# Patient Record
Sex: Male | Born: 1935 | Hispanic: No | Marital: Single | Smoking: Never smoker
Health system: Southern US, Community
[De-identification: ages and names within clinical notes are randomized; demographics above are authoritative.]

## PROBLEM LIST (undated history)

## (undated) DIAGNOSIS — F1010 Alcohol abuse, uncomplicated: Secondary | ICD-10-CM

---

## 2015-06-21 ENCOUNTER — Inpatient Hospital Stay (HOSPITAL_COMMUNITY)
Admission: EM | Admit: 2015-06-21 | Discharge: 2015-06-27 | DRG: 064 | Disposition: A | Discharge status: Rehabilitation Facility | Payer: Medicare Other | Attending: Internal Medicine | Admitting: Internal Medicine

## 2015-06-21 ENCOUNTER — Encounter (HOSPITAL_COMMUNITY): Payer: Self-pay | Admitting: Emergency Medicine

## 2015-06-21 ENCOUNTER — Emergency Department (HOSPITAL_COMMUNITY): Payer: Medicare Other

## 2015-06-21 DIAGNOSIS — J9600 Acute respiratory failure, unspecified whether with hypoxia or hypercapnia: Secondary | ICD-10-CM | POA: Diagnosis present

## 2015-06-21 DIAGNOSIS — I161 Hypertensive emergency: Secondary | ICD-10-CM | POA: Diagnosis present

## 2015-06-21 DIAGNOSIS — I611 Nontraumatic intracerebral hemorrhage in hemisphere, cortical: Secondary | ICD-10-CM | POA: Diagnosis not present

## 2015-06-21 DIAGNOSIS — G9340 Encephalopathy, unspecified: Secondary | ICD-10-CM | POA: Diagnosis present

## 2015-06-21 DIAGNOSIS — R739 Hyperglycemia, unspecified: Secondary | ICD-10-CM | POA: Diagnosis present

## 2015-06-21 DIAGNOSIS — G936 Cerebral edema: Secondary | ICD-10-CM | POA: Diagnosis present

## 2015-06-21 DIAGNOSIS — I10 Essential (primary) hypertension: Secondary | ICD-10-CM

## 2015-06-21 DIAGNOSIS — R402433 Glasgow coma scale score 3-8, at hospital admission: Secondary | ICD-10-CM | POA: Diagnosis present

## 2015-06-21 DIAGNOSIS — Z4659 Encounter for fitting and adjustment of other gastrointestinal appliance and device: Secondary | ICD-10-CM

## 2015-06-21 DIAGNOSIS — R4701 Aphasia: Secondary | ICD-10-CM | POA: Diagnosis present

## 2015-06-21 DIAGNOSIS — I619 Nontraumatic intracerebral hemorrhage, unspecified: Secondary | ICD-10-CM

## 2015-06-21 DIAGNOSIS — R451 Restlessness and agitation: Secondary | ICD-10-CM | POA: Diagnosis present

## 2015-06-21 DIAGNOSIS — S0633AA Contusion and laceration of cerebrum, unspecified, with loss of consciousness status unknown, initial encounter: Secondary | ICD-10-CM

## 2015-06-21 DIAGNOSIS — R0902 Hypoxemia: Secondary | ICD-10-CM

## 2015-06-21 DIAGNOSIS — S06360A Traumatic hemorrhage of cerebrum, unspecified, without loss of consciousness, initial encounter: Secondary | ICD-10-CM

## 2015-06-21 DIAGNOSIS — J9690 Respiratory failure, unspecified, unspecified whether with hypoxia or hypercapnia: Secondary | ICD-10-CM

## 2015-06-21 DIAGNOSIS — E876 Hypokalemia: Secondary | ICD-10-CM | POA: Diagnosis present

## 2015-06-21 DIAGNOSIS — F10239 Alcohol dependence with withdrawal, unspecified: Secondary | ICD-10-CM | POA: Diagnosis present

## 2015-06-21 DIAGNOSIS — R569 Unspecified convulsions: Secondary | ICD-10-CM

## 2015-06-21 HISTORY — DX: Alcohol abuse, uncomplicated: F10.10

## 2015-06-21 LAB — CBC
HCT: 46.7 % (ref 39.0–52.0)
Hemoglobin: 15.4 g/dL (ref 13.0–17.0)
MCH: 29.8 pg (ref 26.0–34.0)
MCHC: 33 g/dL (ref 30.0–36.0)
MCV: 90.3 fL (ref 78.0–100.0)
Platelets: 254 10*3/uL (ref 150–400)
RBC: 5.17 MIL/uL (ref 4.22–5.81)
RDW: 13.2 % (ref 11.5–15.5)
WBC: 16.1 10*3/uL — AB (ref 4.0–10.5)

## 2015-06-21 LAB — MRSA PCR SCREENING: MRSA by PCR: NEGATIVE

## 2015-06-21 LAB — COMPREHENSIVE METABOLIC PANEL
ALBUMIN: 3.7 g/dL (ref 3.5–5.0)
ALK PHOS: 78 U/L (ref 38–126)
ALT: 24 U/L (ref 17–63)
AST: 39 U/L (ref 15–41)
Anion gap: 7 (ref 5–15)
BILIRUBIN TOTAL: 1 mg/dL (ref 0.3–1.2)
BUN: 11 mg/dL (ref 6–20)
CO2: 28 mmol/L (ref 22–32)
CREATININE: 0.68 mg/dL (ref 0.61–1.24)
Calcium: 9 mg/dL (ref 8.9–10.3)
Chloride: 101 mmol/L (ref 101–111)
GFR calc Af Amer: >60 mL/min (ref >60)
GLUCOSE: 141 mg/dL — AB (ref 65–99)
Potassium: 4 mmol/L (ref 3.5–5.1)
Sodium: 136 mmol/L (ref 135–145)
TOTAL PROTEIN: 7.6 g/dL (ref 6.5–8.1)

## 2015-06-21 LAB — I-STAT TROPONIN, ED: TROPONIN I, POC: 0 ng/mL (ref 0.00–0.08)

## 2015-06-21 LAB — I-STAT CHEM 8, ED
BUN: 14 mg/dL (ref 6–20)
CHLORIDE: 98 mmol/L — AB (ref 101–111)
CREATININE: 0.7 mg/dL (ref 0.61–1.24)
Calcium, Ion: 1.21 mmol/L (ref 1.13–1.30)
GLUCOSE: 141 mg/dL — AB (ref 65–99)
HEMATOCRIT: 51 % (ref 39.0–52.0)
Hemoglobin: 17.3 g/dL — ABNORMAL HIGH (ref 13.0–17.0)
POTASSIUM: 4 mmol/L (ref 3.5–5.1)
Sodium: 138 mmol/L (ref 135–145)
TCO2: 30 mmol/L (ref 0–100)

## 2015-06-21 LAB — DIFFERENTIAL
BASOS ABS: 0 10*3/uL (ref 0.0–0.1)
Basophils Relative: 0 %
Eosinophils Absolute: 0 10*3/uL (ref 0.0–0.7)
Eosinophils Relative: 0 %
LYMPHS ABS: 1.7 10*3/uL (ref 0.7–4.0)
LYMPHS PCT: 11 %
MONOS PCT: 4 %
Monocytes Absolute: 0.7 10*3/uL (ref 0.1–1.0)
NEUTROS ABS: 13.6 10*3/uL — AB (ref 1.7–7.7)
NEUTROS PCT: 85 %

## 2015-06-21 LAB — PROTIME-INR
INR: 1.11 (ref 0.00–1.49)
Prothrombin Time: 14.5 seconds (ref 11.6–15.2)

## 2015-06-21 LAB — APTT: APTT: 30 s (ref 24–37)

## 2015-06-21 LAB — CBG MONITORING, ED: Glucose-Capillary: 154 mg/dL — ABNORMAL HIGH (ref 65–99)

## 2015-06-21 MED ORDER — PROPOFOL 1000 MG/100ML IV EMUL
5.0000 ug/kg/min | INTRAVENOUS | Status: DC
  Administered 2015-06-21 – 2015-06-22 (×3): 70 ug/kg/min via INTRAVENOUS
  Administered 2015-06-22: 50 ug/kg/min via INTRAVENOUS
  Administered 2015-06-22 – 2015-06-23 (×3): 70 ug/kg/min via INTRAVENOUS
  Filled 2015-06-21 (×10): qty 100

## 2015-06-21 MED ORDER — PANTOPRAZOLE SODIUM 40 MG IV SOLR
40.0000 mg | Freq: Every day | INTRAVENOUS | Status: DC
  Administered 2015-06-21 – 2015-06-26 (×6): 40 mg via INTRAVENOUS
  Filled 2015-06-21 (×6): qty 40

## 2015-06-21 MED ORDER — MIDAZOLAM HCL 2 MG/2ML IJ SOLN
INTRAMUSCULAR | Status: AC
  Administered 2015-06-21: 5 mg via INTRAVENOUS
  Filled 2015-06-21: qty 6

## 2015-06-21 MED ORDER — LIDOCAINE HCL (CARDIAC) 20 MG/ML IV SOLN
100.0000 mg | Freq: Once | INTRAVENOUS | Status: AC
  Administered 2015-06-21: 100 mg via INTRAVENOUS

## 2015-06-21 MED ORDER — CHLORHEXIDINE GLUCONATE 0.12% ORAL RINSE (MEDLINE KIT)
15.0000 mL | Freq: Two times a day (BID) | OROMUCOSAL | Status: DC
Start: 2015-06-21 — End: 2015-06-24
  Administered 2015-06-21 – 2015-06-24 (×7): 15 mL via OROMUCOSAL

## 2015-06-21 MED ORDER — ACETAMINOPHEN 650 MG RE SUPP: 650.0000 mg | RECTAL | Status: DC | PRN

## 2015-06-21 MED ORDER — SENNOSIDES-DOCUSATE SODIUM 8.6-50 MG PO TABS
1.0000 | ORAL_TABLET | Freq: Two times a day (BID) | ORAL | Status: DC
  Administered 2015-06-22 – 2015-06-27 (×8): 1 via ORAL
  Filled 2015-06-21 (×9): qty 1

## 2015-06-21 MED ORDER — LEVETIRACETAM 500 MG/5ML IV SOLN
1000.0000 mg | Freq: Once | INTRAVENOUS | Status: AC
  Administered 2015-06-21: 1000 mg via INTRAVENOUS
  Filled 2015-06-21: qty 10

## 2015-06-21 MED ORDER — PROPOFOL 1000 MG/100ML IV EMUL
5.0000 ug/kg/min | Freq: Once | INTRAVENOUS | Status: AC
  Administered 2015-06-21: 5 ug/kg/min via INTRAVENOUS
  Filled 2015-06-21: qty 100

## 2015-06-21 MED ORDER — SODIUM CHLORIDE 0.9 % IV SOLN
500.0000 mg | Freq: Two times a day (BID) | INTRAVENOUS | Status: DC
  Administered 2015-06-21 – 2015-06-26 (×11): 500 mg via INTRAVENOUS
  Filled 2015-06-21 (×12): qty 5

## 2015-06-21 MED ORDER — STROKE: EARLY STAGES OF RECOVERY BOOK
Freq: Once | Status: AC
  Administered 2015-06-21: 1
  Filled 2015-06-21: qty 1

## 2015-06-21 MED ORDER — ANTISEPTIC ORAL RINSE SOLUTION (CORINZ)
7.0000 mL | Freq: Four times a day (QID) | OROMUCOSAL | Status: DC
  Administered 2015-06-21 – 2015-06-24 (×11): 7 mL via OROMUCOSAL

## 2015-06-21 MED ORDER — ETOMIDATE 2 MG/ML IV SOLN
20.0000 mg | Freq: Once | INTRAVENOUS | Status: AC
  Administered 2015-06-21: 20 mg via INTRAVENOUS

## 2015-06-21 MED ORDER — LABETALOL HCL 5 MG/ML IV SOLN
10.0000 mg | INTRAVENOUS | Status: DC | PRN
  Administered 2015-06-24: 40 mg via INTRAVENOUS
  Administered 2015-06-24: 20 mg via INTRAVENOUS
  Filled 2015-06-21: qty 4
  Filled 2015-06-21: qty 8

## 2015-06-21 MED ORDER — SUCCINYLCHOLINE CHLORIDE 20 MG/ML IJ SOLN
100.0000 mg | Freq: Once | INTRAMUSCULAR | Status: DC
  Filled 2015-06-21: qty 5

## 2015-06-21 MED ORDER — SODIUM CHLORIDE 0.9 % IV SOLN
INTRAVENOUS | Status: DC
  Administered 2015-06-21 – 2015-06-22 (×2): via INTRAVENOUS

## 2015-06-21 MED ORDER — SUCCINYLCHOLINE CHLORIDE 20 MG/ML IJ SOLN
120.0000 mg | Freq: Once | INTRAMUSCULAR | Status: DC
  Administered 2015-06-21: 100 mg via INTRAVENOUS
  Filled 2015-06-21: qty 6

## 2015-06-21 MED ORDER — ACETAMINOPHEN 325 MG PO TABS: 650.0000 mg | ORAL_TABLET | ORAL | Status: DC | PRN

## 2015-06-21 MED ORDER — NICARDIPINE HCL IN NACL 20-0.86 MG/200ML-% IV SOLN
3.0000 mg/h | INTRAVENOUS | Status: DC
  Administered 2015-06-24: 5 mg/h via INTRAVENOUS
  Filled 2015-06-21: qty 200

## 2015-06-21 MED ORDER — DEXAMETHASONE SODIUM PHOSPHATE 10 MG/ML IJ SOLN
10.0000 mg | Freq: Once | INTRAMUSCULAR | Status: AC
  Administered 2015-06-21: 10 mg via INTRAVENOUS
  Filled 2015-06-21: qty 1

## 2015-06-21 MED ORDER — LORAZEPAM 2 MG/ML IJ SOLN
1.0000 mg | Freq: Once | INTRAMUSCULAR | Status: AC
  Administered 2015-06-21: 1 mg via INTRAVENOUS
  Filled 2015-06-21: qty 1

## 2015-06-21 MED ORDER — NICARDIPINE HCL IN NACL 20-0.86 MG/200ML-% IV SOLN
3.0000 mg/h | Freq: Once | INTRAVENOUS | Status: AC
  Administered 2015-06-21: 3 mg/h via INTRAVENOUS
  Filled 2015-06-21: qty 200

## 2015-06-21 MED ORDER — MIDAZOLAM HCL 5 MG/5ML IJ SOLN
5.0000 mg | Freq: Once | INTRAMUSCULAR | Status: AC
  Administered 2015-06-21: 5 mg via INTRAVENOUS

## 2015-06-21 NOTE — Progress Notes (Signed)
eLink Physician-Brief Progress Note Patient Name: Jillyn HiddenGualberto Ponve DOB: March 09, 1927 MRN: [id number]   Date of Service  06/21/2015  HPI/Events of Note  Patient arrives in ICU. Nurse needs several orders.  eICU Interventions  Will order: 1. Propofol IV infusion. Titrate to RASS = -1. 2. Nicardipine IV infusion. Titrate to keep SBP < 160. 3. OGT to LIS.     Intervention Category Intermediate Interventions: Hypertension - evaluation and management Minor Interventions: Agitation / anxiety - evaluation and management;Routine modifications to care plan (e.g. PRN medications for pain, fever)  Sommer,Steven Eugene 06/21/2015, 6:28 PM

## 2015-06-21 NOTE — Code Documentation (Signed)
Pt. Transferred from CT 3 , due to decreased lOC  And to protect airway

## 2015-06-21 NOTE — Consult Note (Addendum)
Referring Physician: Dr Fayrene FearingJames, ED    Chief Complaint: AMS, new onset seizure, ICH on CT  HPI:                                                                                                                                         Charles Bullock is an 81 y.o. male with a past medical history significant for alcohol abuse and otherwise unknown PMH, brought in for evaluation of mental state changes. Patient is currently intubated and thus all clinical information was obtained from the medical record. He was taken to the ED " after his family noticed behavior changes. Last seen normal last night, then this morning was noted to be confused, putting shoes on the wrong feet, garbled speech. Had rapidly declining mental status in ER, requiring intubation, CT head revealed large L ICH. Had one witnessed seizure". Patient was intubated for airway protection. Loaded with 1 gram IV keppra. No particularly hypertensive in the ED, current SBP 122. CT brain was personally reviewed and showed a lobar left posterior parietal parenchymal hematoma without significant edema, midline shift, or IV extension. Serologies reviewed:INR 1.11, PTT 30, platelet count 244, wbc 16.1  Date last known well: 06/21/15  Time last known well: uncertain tPA Given: no, ICH ICH volume < 30 cc ICH score: 2   Past Medical History  Diagnosis Date  . ETOH abuse     History reviewed. No pertinent past surgical history.  History reviewed. No pertinent family history. Social History:  reports that he has never smoked. He does not have any smokeless tobacco history on file. He reports that he does not drink alcohol or use illicit drugs.  Allergies: No Known Allergies  Medications:                                                                                                                           I have reviewed the patient's current medications.  ROS: unable to obtain due to mental status, intubation  History obtained from chart review   Physical exam:  Constitutional: critically ill, intubated on the vent. Blood pressure 149/93, pulse 110, resp. rate 18, height  (1.651 m), weight 58.968 kg (130 lb), SpO2 97 %. Eyes: no jaundice or exophthalmos.  Head: normocephalic. Neck: supple, no bruits, no JVD. Cardiac: no murmurs. Lungs: clear. Abdomen: soft, no tender, no mass. Extremities: no edema, clubbing, or cyanosis.  Skin: no rash  Neurologic Examination:                                                                                                      General: NAD Mental Status: Paralyzed, intubated on the vent. Cranial Nerves: Pupils 2 mm, reactive. No gaze preference. Face symmetric. Tongue: intubated Motor: Paralyzed. Tone decreased. Sensory: Pinprick and light touch intact throughout, bilaterally Deep Tendon Reflexes:  Unable to elicit but paralyzed Plantars: Mute  Cerebellar: Unable to test due to mental status Gait:  Unable to test due to mental status.   Results for orders placed or performed during the hospital encounter of 06/21/15 (from the past 48 hour(s))  CBG monitoring, ED     Status: Abnormal   Collection Time: 06/21/15  1:31 PM  Result Value Ref Range   Glucose-Capillary 154 (H) 65 - 99 mg/dL  Protime-INR     Status: None   Collection Time: 06/21/15  2:00 PM  Result Value Ref Range   Prothrombin Time 14.5 11.6 - 15.2 seconds   INR 1.11 0.00 - 1.49  APTT     Status: None   Collection Time: 06/21/15  2:00 PM  Result Value Ref Range   aPTT 30 24 - 37 seconds  CBC     Status: Abnormal   Collection Time: 06/21/15  2:00 PM  Result Value Ref Range   WBC 16.1 (H) 4.0 - 10.5 K/uL   RBC 5.17 4.22 - 5.81 MIL/uL   Hemoglobin 15.4 13.0 - 17.0 g/dL   HCT 57.8 46.9 - 62.9 %   MCV 90.3 78.0 - 100.0 fL   MCH 29.8 26.0 - 34.0 pg   MCHC 33.0  30.0 - 36.0 g/dL   RDW 52.8 41.3 - 24.4 %   Platelets 254 150 - 400 K/uL  Differential     Status: Abnormal   Collection Time: 06/21/15  2:00 PM  Result Value Ref Range   Neutrophils Relative % 85 %   Neutro Abs 13.6 (H) 1.7 - 7.7 K/uL   Lymphocytes Relative 11 %   Lymphs Abs 1.7 0.7 - 4.0 K/uL   Monocytes Relative 4 %   Monocytes Absolute 0.7 0.1 - 1.0 K/uL   Eosinophils Relative 0 %   Eosinophils Absolute 0.0 0.0 - 0.7 K/uL   Basophils Relative 0 %   Basophils Absolute 0.0 0.0 - 0.1 K/uL  I-stat troponin, ED (not at [facility])     Status: None   Collection Time: 06/21/15  2:39 PM  Result Value Ref Range   Troponin i, poc 0.00 0.00 - 0.08 ng/mL   Comment 3  Comment: Due to the release kinetics of cTnI, a negative result within the first hours of the onset of symptoms does not rule out myocardial infarction with certainty. If myocardial infarction is still suspected, repeat the test at appropriate intervals.   I-Stat Chem 8, ED  (not at [facility], [facility])     Status: Abnormal   Collection Time: 06/21/15  2:40 PM  Result Value Ref Range   Sodium 138 135 - 145 mmol/L   Potassium 4.0 3.5 - 5.1 mmol/L   Chloride 98 (L) 101 - 111 mmol/L   BUN 14 6 - 20 mg/dL   Creatinine, Ser 1.61 0.61 - 1.24 mg/dL   Glucose, Bld 096 (H) 65 - 99 mg/dL   Calcium, Ion 0.45 4.09 - 1.30 mmol/L   TCO2 30 0 - 100 mmol/L   Hemoglobin 17.3 (H) 13.0 - 17.0 g/dL   HCT 81.1 91.4 - 78.2 %   Ct Head Wo Contrast  06/21/2015  CLINICAL DATA:  Seizure. EXAM: CT HEAD WITHOUT CONTRAST TECHNIQUE: Contiguous axial images were obtained from the base of the skull through the vertex without intravenous contrast. COMPARISON:  None. FINDINGS: Within the left posterior parietal lobe there is a hyperdense mass with surrounding low attenuation edema. The mass measures 5 x 3.3 cm. This is compatible with an area of hemorrhagic infarct. There is prominence of the sulci and ventricles consistent with brain atrophy. Diffuse  low attenuation within the subcortical and periventricular white matter is identified consistent with chronic microvascular disease. Chronic lacunar infarct is identified within the right basal ganglia. There is no midline shift. No intraventricular hemorrhage. But the paranasal sinuses and mastoid air cells are clear. The calvarium is intact. IMPRESSION: 1. Left posterior parietal parenchymal hematoma is identified. Findings compatible with hemorrhagic infarct. Underlying lesion not excluded. Electronically Signed   By: Signa Kell M.D.   On: 06/21/2015 14:34   Dg Chest Port 1 View  06/21/2015  CLINICAL DATA:  Status post intubation EXAM: PORTABLE CHEST 1 VIEW COMPARISON:  None. FINDINGS: The ET tube tip is at the level of the carina and is directed towards the right mainstem bronchus. Consider retracting tube by 1-2 cm. Heart size is normal. There is no pleural fluid. Asymmetric opacification of the right apex and right upper lobe scar or atelectasis is noted. IMPRESSION: 1. ET tube tip is at the carina and directed towards the right mainstem bronchus. Consider retracting by 1-2 cm. 2. Asymmetric opacification of the right apex, etiology uncertain. Suggest further evaluation with CT chest as patient's clinical condition tolerates. These results will be called to the ordering clinician or representative by the Radiologist Assistant, and communication documented in the PACS or zVision Dashboard. Electronically Signed   By: Signa Kell M.D.   On: 06/21/2015 15:18     Assessment: 81 y.o. male with new onset mental state changes, isolated seizure, and CT brain revealing a lobar left posterior parietal parenchymal hematoma without significant edema, midline shift, or IV extension. Isolated seizure is symptomatic of ICH. The lobar location and the fact that he was not particularly hypertensive makes me think this could be ICH secondary to CAA. Unusual location for aneurysmal hemorrhage. He is not  thrombocytopenic and no reported head trauma. Ordered: MRI/MRA brain to further investigate ICH etiology. Follow up CT head in 24 hours or before if clinically warranted. Continue keppra 500 mg iv BID. Target SBP<160. Stroke team will follow up tomorrow.   Stroke Risk Factors -age, ETOH abuse    [place],  MD [facility] [phone]  06/21/2015, 3:21 PM

## 2015-06-21 NOTE — ED Notes (Signed)
Son  Reports Pt drinks 8-cans of beer daily . Son reports last drank on Tuesday .

## 2015-06-21 NOTE — ED Notes (Signed)
Son reports the PT was able to shower with out help last night and Pt dressed unassisted this AM. Son reports Pt woke up and was purple at 9030 this AM. Pt unable to follow directions . Pt was able to stand with assistance  To transfer  to bed.

## 2015-06-21 NOTE — ED Notes (Signed)
EDP James at bed side Pt having seizure. Ativan given.

## 2015-06-21 NOTE — Progress Notes (Signed)
eLink Physician-Brief Progress Note Patient Name: Charles Bullock DOB: 08/15/1926 MRN: [id number]   Date of Service  06/21/2015  HPI/Events of Note  Request for bilateral wrist restraints.  eICU Interventions  Will order bilateral wrist restraints.      Intervention Category Minor Interventions: Agitation / anxiety - evaluation and management  Sommer,Steven Eugene 06/21/2015, 9:55 PM

## 2015-06-21 NOTE — ED Provider Notes (Signed)
CSN: [id number]     Arrival date & time 06/21/15  1216 History   First MD Initiated Contact with Patient 06/21/15 1245     Chief Complaint  Patient presents with  . Stroke Symptoms  . Seizures      HPI  Patient presents for evaluation of  "changes in his behavior".  He is Spanish-speaking only. He arrives with his family. His last time no normal was last night (06/20/2015)  at 10 PM going to bed.  Family states that he seen normal last night was speaking walking interacting with his family normally. His morning upon getting up he had difficult to dressing himself. He put his shoes on the wrong feet. He was not speaking words". Family brought him in for evaluation. No known recent fall or traumas.  No history of hypertension diabetes no history of stroke, heart disease, peripheral vascular disease.  She drinks 8-10 beers daily. No known history of withdrawal or seizures. No describes seizures this morning.  Family states that he cannot walk without difficulty this morning. They do not understand any appreciable words.  Past Medical History  Diagnosis Date  . ETOH abuse    History reviewed. No pertinent past surgical history. History reviewed. No pertinent family history. Social History  Substance Use Topics  . Smoking status: Never Smoker   . Smokeless tobacco: None  . Alcohol Use: No    Review of Systems  Unable to perform ROS: Mental status change      Allergies  Review of patient's allergies indicates no known allergies.  Home Medications   Prior to Admission medications   Not on File   BP 149/93 mmHg  Pulse 110  Resp 18  Ht 5\' 5"  (1.651 m)  Wt 130 lb (58.968 kg)  BMI 21.63 kg/m2  SpO2 97% Physical Exam  Constitutional: He is oriented to person, place, and time. He appears well-developed and well-nourished. No distress.  HENT:  Head: Normocephalic.  Normal tongue protrusion. Normal posterior pharynx. Normal sensation V1 through V3 in the face.  Eyes:  Conjunctivae are normal. Pupils are equal, round, and reactive to light. No scleral icterus.  Right hemianopsia. No extra ocular movement palsy's. No upper or   lower facial droop.  Neck: Normal range of motion. Neck supple. No thyromegaly present.  No carotid bruits.  Cardiovascular: Normal rate and regular rhythm.  Exam reveals no gallop and no friction rub.   No murmur heard. Sinus rhythm on the monitor.  Pulmonary/Chest: Effort normal and breath sounds normal. No respiratory distress. He has no wheezes. He has no rales.  Clear bilateral breath sounds. 87% saturation. Placed on 2 L nasal cannula. Clear lungs. No increased worker breathing.  Abdominal: Soft. Bowel sounds are normal. He exhibits no distension. There is no tenderness. There is no rebound.  Musculoskeletal: Normal range of motion.  Neurological: He is alert and oriented to person, place, and time.  Clinically has a right-sided neglect. Right hemianopsia. Right arm dysmetria and weakness. Right leg weakness.  Skin: Skin is warm and dry. No rash noted.  Psychiatric: He has a normal mood and affect. His behavior is normal.   no tremor or sign of significant withdrawal at this time.  ED Course  Procedures (including critical care time) Labs Review Labs Reviewed  CBC - Abnormal; Notable for the following:    WBC 16.1 (*)    All other components within normal limits  DIFFERENTIAL - Abnormal; Notable for the following:    Neutro Abs 13.6 (*)  All other components within normal limits  CBG MONITORING, ED - Abnormal; Notable for the following:    Glucose-Capillary 154 (*)    All other components within normal limits  I-STAT CHEM 8, ED - Abnormal; Notable for the following:    Chloride 98 (*)    Glucose, Bld 141 (*)    Hemoglobin 17.3 (*)    All other components within normal limits  PROTIME-INR  APTT  COMPREHENSIVE METABOLIC PANEL  I-STAT TROPOININ, ED    Imaging Review Ct Head Wo Contrast  06/21/2015  CLINICAL  DATA:  Seizure. EXAM: CT HEAD WITHOUT CONTRAST TECHNIQUE: Contiguous axial images were obtained from the base of the skull through the vertex without intravenous contrast. COMPARISON:  None. FINDINGS: Within the left posterior parietal lobe there is a hyperdense mass with surrounding low attenuation edema. The mass measures 5 x 3.3 cm. This is compatible with an area of hemorrhagic infarct. There is prominence of the sulci and ventricles consistent with brain atrophy. Diffuse low attenuation within the subcortical and periventricular white matter is identified consistent with chronic microvascular disease. Chronic lacunar infarct is identified within the right basal ganglia. There is no midline shift. No intraventricular hemorrhage. But the paranasal sinuses and mastoid air cells are clear. The calvarium is intact. IMPRESSION: 1. Left posterior parietal parenchymal hematoma is identified. Findings compatible with hemorrhagic infarct. Underlying lesion not excluded. Electronically Signed   By: Signa Kell M.D.   On: 06/21/2015 14:34   I have personally reviewed and evaluated these images and lab results as part of my medical decision-making.   EKG Interpretation None      MDM   Final diagnoses:  Intraparenchymal hemorrhage of brain (HCC)  Seizure (HCC)  Malignant hypertension    Patient presents with over 15 hours of symptoms. Well out of the window for acute lytic therapy. Symptoms suggestive of left MCA stroke.  Has not had alcohol in 5 days. However, is not tremulous, or tachycardic. No described seizure this morning.  Differential diagnoses would include left MCA stroke, hemorrhage. Also Todd's paresis after seizure  (unwitnessed).  CT to rule out hemorrhage. Further diagnostics ongoing.  14:00:  Called to room by RN for generalized seizure. Arrived to find patient with eyes deviated right. Generalized tonic-clonic activity. No incontinence or tongue biting. Slowly improving over 1-2  minutes. Given IV Ativan 1 mg. Will load with Keppra. Differential diagnosis remains the same as above pending CT.  15:14:  Patient had additional seizure in CT. CT scan shows left parietal occipital intraparenchymal hemorrhage with surrounding vasogenic edema. No midline shift. Patient's middle status deteriorated. No protecting his airway. Decreasing respiratory rate. Not verbally responsive. I discussed this at length with family. He does not have any established DO NOT RESUSCITATE, for plan for end-of-life care. Family feels he would want full resuscitative measures. They're agreeable with intubation.  INTUBATION Performed by: Claudean Kinds  Required items: required blood products, implants, devices, and special equipment available Patient identity confirmed: provided demographic data and hospital-assigned identification number Time out: Immediately prior to procedure a "time out" was called to verify the correct patient, procedure, equipment, support staff and site/side marked as required.  Indications: Decreased level of consciousness, airway protection.   Intubation method: +Glidescope Laryngoscopy   Preoxygenation: +BVM Pretreatment lidocaine 100 mg prior to sedation and paralytic as CNS prophylaxis. Sedatives: +Etomidate Paralytic: +Succinylcholine  Tube Size: 7.5 cuffed  Post-procedure assessment: chest rise and ETCO2 monitor Breath sounds: equal and absent over the epigastrium Tube secured  with: ETT holder Chest x-ray interpreted by radiologist and me.  Chest x-ray findings: +endotracheal tube just proximal to the carina. Withdrawal 27 m and resecured.   Patient tolerated the procedure well with no immediate complications.  Family taken back to the room. Critical care consultation. Dr. Craige Cotta present in the room.   CRITICAL CARE Performed by: Rolland Porter JOSEPH   Total critical care time: 60 minutes  Critical care time was exclusive of separately billable  procedures and treating other patients.  Critical care was necessary to treat or prevent imminent or life-threatening deterioration.  Critical care was time spent personally by me on the following activities: development of treatment plan with patient and/or surrogate as well as nursing, discussions with consultants, evaluation of patient's response to treatment, examination of patient, obtaining history from patient or surrogate, ordering and performing treatments and interventions, ordering and review of laboratory studies, ordering and review of radiographic studies, pulse oximetry and re-evaluation of patient's condition. Care      Rolland Porter, MD 06/21/15 [phone]

## 2015-06-21 NOTE — ED Notes (Signed)
Pt here with family c/o trouble understanding pt speech this am upon waking and pt put his shoes on backwards; per family is aphasia; pt stumbling when walking; pt unable to follow commands

## 2015-06-21 NOTE — H&P (Signed)
PULMONARY / CRITICAL CARE MEDICINE   Name: Charles Bullock MRN: [id number] DOB: 1927-04-30    ADMISSION DATE:  06/21/2015  REFERRING MD:  EDP   CHIEF COMPLAINT:  Respiratory failure, AMS   HISTORY OF PRESENT ILLNESS:   81yo spanish speaking male with hx heavy ETOH, otherwise unknown PMH presented 12/17 after his family noticed behavior changes.  Last seen normal last night, then this morning was noted to be confused, putting shoes on the wrong feet, garbled speech.  Had rapidly declining mental status in ER, requiring intubation, CT head revealed large L ICH.  Had one witness seizure, none further noted. PCCM called for ICU admission.   Per family no known hx THN, DM, previous stroke.    PAST MEDICAL HISTORY :  He  has a past medical history of ETOH abuse.  PAST SURGICAL HISTORY: He  has no past surgical history on file.  No Known Allergies  No current facility-administered medications on file prior to encounter.   No current outpatient prescriptions on file prior to encounter.    FAMILY HISTORY:  No known family hx bleeding disorders   SOCIAL HISTORY: He  reports that he has never smoked. He does not have any smokeless tobacco history on file. He reports that he does not drink alcohol or use illicit drugs.  REVIEW OF SYSTEMS:   As per HPI obtained from chart and family - All other systems reviewed and were neg.    SUBJECTIVE:    VITAL SIGNS: BP 149/93 mmHg  Pulse 110  Resp 18  Ht  (1.651 m)  Wt 130 lb (58.968 kg)  BMI 21.63 kg/m2  SpO2 97%  HEMODYNAMICS:    VENTILATOR SETTINGS: Vent Mode:  [-] PRVC FiO2 (%):  [40 %] 40 % Set Rate:  [18 bmp] 18 bmp Vt Set:  [500 mL] 500 mL PEEP:  [5 cmH20] 5 cmH20 Plateau Pressure:  [14 cmH20] 14 cmH20  INTAKE / OUTPUT:    PHYSICAL EXAMINATION: General:  Thin, elderly male, NAD  Neuro:  Unresponsive, pupils reactive HEENT:  Mm moist, ETT Cardiovascular:  s1s2 rrr Lungs:  resps even non labored on  vent Abdomen:  Soft, hypoactive bs  Musculoskeletal:  Warm and dry, no edema   LABS:  BMET  Recent Labs Lab 06/21/15 1400 06/21/15 1440  NA 136 138  K 4.0 4.0  CL 101 98*  CO2 28  --   BUN 11 14  CREATININE 0.68 0.70  GLUCOSE 141* 141*    Electrolytes  Recent Labs Lab 06/21/15 1400  CALCIUM 9.0    CBC  Recent Labs Lab 06/21/15 1400 06/21/15 1440  WBC 16.1*  --   HGB 15.4 17.3*  HCT 46.7 51.0  PLT 254  --     Coag's  Recent Labs Lab 06/21/15 1400  APTT 30  INR 1.11    Sepsis Markers No results for input(s): LATICACIDVEN, PROCALCITON, O2SATVEN in the last 168 hours.  ABG No results for input(s): PHART, PCO2ART, PO2ART in the last 168 hours.  Liver Enzymes  Recent Labs Lab 06/21/15 1400  AST 39  ALT 24  ALKPHOS 78  BILITOT 1.0  ALBUMIN 3.7    Cardiac Enzymes No results for input(s): TROPONINI, PROBNP in the last 168 hours.  Glucose  Recent Labs Lab 06/21/15 1331  GLUCAP 154*    Imaging Ct Head Wo Contrast  06/21/2015  CLINICAL DATA:  Seizure. EXAM: CT HEAD WITHOUT CONTRAST TECHNIQUE: Contiguous axial images were obtained from the base of the skull  through the vertex without intravenous contrast. COMPARISON:  None. FINDINGS: Within the left posterior parietal lobe there is a hyperdense mass with surrounding low attenuation edema. The mass measures 5 x 3.3 cm. This is compatible with an area of hemorrhagic infarct. There is prominence of the sulci and ventricles consistent with brain atrophy. Diffuse low attenuation within the subcortical and periventricular white matter is identified consistent with chronic microvascular disease. Chronic lacunar infarct is identified within the right basal ganglia. There is no midline shift. No intraventricular hemorrhage. But the paranasal sinuses and mastoid air cells are clear. The calvarium is intact. IMPRESSION: 1. Left posterior parietal parenchymal hematoma is identified. Findings compatible with  hemorrhagic infarct. Underlying lesion not excluded. Electronically Signed   By: Signa Kellaylor  Stroud M.D.   On: 06/21/2015 14:34   Dg Chest Port 1 View  06/21/2015  CLINICAL DATA:  Status post intubation EXAM: PORTABLE CHEST 1 VIEW COMPARISON:  None. FINDINGS: The ET tube tip is at the level of the carina and is directed towards the right mainstem bronchus. Consider retracting tube by 1-2 cm. Heart size is normal. There is no pleural fluid. Asymmetric opacification of the right apex and right upper lobe scar or atelectasis is noted. IMPRESSION: 1. ET tube tip is at the carina and directed towards the right mainstem bronchus. Consider retracting by 1-2 cm. 2. Asymmetric opacification of the right apex, etiology uncertain. Suggest further evaluation with CT chest as patient's clinical condition tolerates. These results will be called to the ordering clinician or representative by the Radiologist Assistant, and communication documented in the PACS or zVision Dashboard. Electronically Signed   By: Signa Kellaylor  Stroud M.D.   On: 06/21/2015 15:18     STUDIES:  CT head 12/17>>> 1. Left posterior parietal parenchymal hematoma is identified. Findings compatible with hemorrhagic infarct. Underlying lesion not exclude  CULTURES:   ANTIBIOTICS:   SIGNIFICANT EVENTS:   LINES/TUBES: ETT 12/27>>>   DISCUSSION: 81 yo male with hx ETOH presenting with AMS r/t large ICH requiring intubation for airway protection    ASSESSMENT / PLAN:  NEUROLOGIC ICH  AMS  Seizure P:   RASS goal: -1 Neuro following  Continue keppra  Decadron  BP control as above  Ongoing discussion goals of care with family     PULMONARY Acute respiratory failure  P:   Vent support - 8cc/kg  F/u CXR  F/u ABG   CARDIOVASCULAR HTN  P:  cardene gtt  - keep SBP <160   RENAL No active issue  P:   F/u chem   GASTROINTESTINAL Hx ETOH  P:   Thiamine, folate  NPO   HEMATOLOGIC ICH  P:  F/u CBC  SCD's    INFECTIOUS NO active issue  P:   Monitor wbc, fever curve off abx   ENDOCRINE Hyperglycemia - no known hx DM   P:   Monitor glucose on chem, add SSI if >180    FAMILY  - Updates:  Family updated at bedside 12/17 in ER     Dirk DressKaty Whiteheart, NP 06/21/2015  3:33 PM Pager: [phone] or [phone]

## 2015-06-22 ENCOUNTER — Inpatient Hospital Stay (HOSPITAL_COMMUNITY): Payer: Medicare Other

## 2015-06-22 DIAGNOSIS — I161 Hypertensive emergency: Secondary | ICD-10-CM

## 2015-06-22 DIAGNOSIS — J988 Other specified respiratory disorders: Secondary | ICD-10-CM

## 2015-06-22 DIAGNOSIS — F1010 Alcohol abuse, uncomplicated: Secondary | ICD-10-CM

## 2015-06-22 LAB — GLUCOSE, CAPILLARY
Glucose-Capillary: 111 mg/dL — ABNORMAL HIGH (ref 65–99)
Glucose-Capillary: 112 mg/dL — ABNORMAL HIGH (ref 65–99)
Glucose-Capillary: 103 mg/dL — ABNORMAL HIGH (ref 65–99)

## 2015-06-22 LAB — RAPID URINE DRUG SCREEN, HOSP PERFORMED
Amphetamines: NOT DETECTED
Barbiturates: NOT DETECTED
Benzodiazepines: POSITIVE — AB
COCAINE: NOT DETECTED
Opiates: NOT DETECTED
Tetrahydrocannabinol: NOT DETECTED

## 2015-06-22 LAB — CBC
HEMATOCRIT: 43.3 % (ref 39.0–52.0)
HEMOGLOBIN: 14.6 g/dL (ref 13.0–17.0)
MCH: 29.7 pg (ref 26.0–34.0)
MCHC: 33.7 g/dL (ref 30.0–36.0)
MCV: 88 fL (ref 78.0–100.0)
Platelets: 255 10*3/uL (ref 150–400)
RBC: 4.92 MIL/uL (ref 4.22–5.81)
RDW: 13.1 % (ref 11.5–15.5)
WBC: 12.5 10*3/uL — AB (ref 4.0–10.5)

## 2015-06-22 LAB — BLOOD GAS, ARTERIAL
Acid-base deficit: 1.1 mmol/L (ref 0.0–2.0)
BICARBONATE: 21.9 meq/L (ref 20.0–24.0)
Drawn by: 41308
FIO2: 0.4
LHR: 18 {breaths}/min
O2 SAT: 98.8 %
PATIENT TEMPERATURE: 98.6
PCO2 ART: 29.3 mmHg — AB (ref 35.0–45.0)
PEEP: 5 cmH2O
TCO2: 22.8 mmol/L (ref 0–100)
VT: 500 mL
pH, Arterial: 7.485 — ABNORMAL HIGH (ref 7.350–7.450)
pO2, Arterial: 135 mmHg — ABNORMAL HIGH (ref 80.0–100.0)

## 2015-06-22 LAB — BASIC METABOLIC PANEL
ANION GAP: 9 (ref 5–15)
BUN: 10 mg/dL (ref 6–20)
CALCIUM: 9.1 mg/dL (ref 8.9–10.3)
CO2: 24 mmol/L (ref 22–32)
Chloride: 101 mmol/L (ref 101–111)
Creatinine, Ser: 0.6 mg/dL — ABNORMAL LOW (ref 0.61–1.24)
GFR calc Af Amer: >60 mL/min (ref >60)
GLUCOSE: 151 mg/dL — AB (ref 65–99)
POTASSIUM: 4.1 mmol/L (ref 3.5–5.1)
SODIUM: 134 mmol/L — AB (ref 135–145)

## 2015-06-22 MED ORDER — THIAMINE HCL 100 MG/ML IJ SOLN
100.0000 mg | Freq: Every day | INTRAMUSCULAR | Status: DC
  Administered 2015-06-22 – 2015-06-25 (×4): 100 mg via INTRAVENOUS
  Filled 2015-06-22 (×4): qty 2

## 2015-06-22 MED ORDER — PNEUMOCOCCAL VAC POLYVALENT 25 MCG/0.5ML IJ INJ
0.5000 mL | INJECTION | INTRAMUSCULAR | Status: DC
  Filled 2015-06-22: qty 0.5

## 2015-06-22 MED ORDER — VITAL HIGH PROTEIN PO LIQD
1000.0000 mL | ORAL | Status: DC
  Administered 2015-06-23 (×2)
  Filled 2015-06-22 (×4): qty 1000

## 2015-06-22 MED ORDER — MIDAZOLAM HCL 2 MG/2ML IJ SOLN
1.0000 mg | INTRAMUSCULAR | Status: DC | PRN
  Administered 2015-06-23 – 2015-06-24 (×4): 1 mg via INTRAVENOUS
  Filled 2015-06-22 (×4): qty 2

## 2015-06-22 MED ORDER — DEXMEDETOMIDINE HCL IN NACL 400 MCG/100ML IV SOLN
0.2000 ug/kg/h | INTRAVENOUS | Status: DC
  Administered 2015-06-22: 0.2 ug/kg/h via INTRAVENOUS
  Administered 2015-06-22: 1 ug/kg/h via INTRAVENOUS
  Filled 2015-06-22 (×2): qty 50

## 2015-06-22 MED ORDER — FOLIC ACID 5 MG/ML IJ SOLN
1.0000 mg | Freq: Every day | INTRAMUSCULAR | Status: DC
  Administered 2015-06-22 – 2015-06-25 (×3): 1 mg via INTRAVENOUS
  Filled 2015-06-22 (×5): qty 0.2

## 2015-06-22 MED ORDER — FENTANYL CITRATE (PF) 100 MCG/2ML IJ SOLN: 50.0000 ug | INTRAMUSCULAR | Status: DC | PRN

## 2015-06-22 MED ORDER — INFLUENZA VAC SPLIT QUAD 0.5 ML IM SUSY
0.5000 mL | PREFILLED_SYRINGE | INTRAMUSCULAR | Status: DC
  Filled 2015-06-22 (×2): qty 0.5

## 2015-06-22 NOTE — Progress Notes (Signed)
Transported to CT scan and back without incident

## 2015-06-22 NOTE — Progress Notes (Signed)
Stroke Team Progress Note  HISTORY 81 y.o. male with a past medical history significant for alcohol abuse and otherwise unknown PMH, brought in for evaluation of mental state changes. Patient is currently intubated and thus all clinical information was obtained from the medical record. He was taken to the ED " after his family noticed behavior changes. Last seen normal last night, then this morning was noted to be confused, putting shoes on the wrong feet, garbled speech. Had rapidly declining mental status in ER, requiring intubation, CT head revealed large L ICH. Had one witnessed seizure". Patient was intubated for airway protection. Loaded with 1 gram IV keppra.  SUBJECTIVE Intubated, agitated, on propofol.    OBJECTIVE Most recent Vital Signs: Filed Vitals:   06/22/15 0600 06/22/15 0800 06/22/15 0900 06/22/15 0909  BP: 102/63 120/99 109/68 109/68  Pulse: 79 78 75 78  Temp:  97.2 F (36.2 C)    TempSrc:  Axillary    Resp: 19 18 16 16   Height:      Weight:      SpO2: 100% 100% 100% 100%   CBG (last 3)   Recent Labs  06/21/15 1331  GLUCAP 154*    IV Fluid Intake:   . sodium chloride 50 mL/hr at 06/21/15 1900  . dexmedetomidine    . niCARDipine    . propofol (DIPRIVAN) infusion 70 mcg/kg/min (06/22/15 0926)    MEDICATIONS  . antiseptic oral rinse  7 mL Mouth Rinse QID  . chlorhexidine gluconate  15 mL Mouth Rinse BID  . feeding supplement (VITAL HIGH PROTEIN)  1,000 mL Per Tube Q24H  . folic acid  1 mg Intravenous Daily  . levETIRAcetam  500 mg Intravenous Q12H  . pantoprazole (PROTONIX) IV  40 mg Intravenous QHS  . senna-docusate  1 tablet Oral BID  . thiamine IV  100 mg Intravenous Daily   PRN:  acetaminophen **OR** acetaminophen, fentaNYL (SUBLIMAZE) injection, labetalol, midazolam  Diet:  Diet NPO time specified  Activity:  Bedrest DVT Prophylaxis:  SCDs  CLINICALLY SIGNIFICANT STUDIES Basic Metabolic Panel:  Recent Labs Lab 06/21/15 1400  06/21/15 1440 06/22/15 0315  NA 136 138 134*  K 4.0 4.0 4.1  CL 101 98* 101  CO2 28  --  24  GLUCOSE 141* 141* 151*  BUN 11 14 10   CREATININE 0.68 0.70 0.60*  CALCIUM 9.0  --  9.1   Liver Function Tests:  Recent Labs Lab 06/21/15 1400  AST 39  ALT 24  ALKPHOS 78  BILITOT 1.0  PROT 7.6  ALBUMIN 3.7   CBC:  Recent Labs Lab 06/21/15 1400 06/21/15 1440 06/22/15 0315  WBC 16.1*  --  12.5*  NEUTROABS 13.6*  --   --   HGB 15.4 17.3* 14.6  HCT 46.7 51.0 43.3  MCV 90.3  --  88.0  PLT 254  --  255   Coagulation:  Recent Labs Lab 06/21/15 1400  LABPROT 14.5  INR 1.11   Cardiac Enzymes: No results for input(s): CKTOTAL, CKMB, CKMBINDEX, TROPONINI in the last 168 hours. Urinalysis: No results for input(s): COLORURINE, LABSPEC, PHURINE, GLUCOSEU, HGBUR, BILIRUBINUR, KETONESUR, PROTEINUR, UROBILINOGEN, NITRITE, LEUKOCYTESUR in the last 168 hours.  Invalid input(s): APPERANCEUR Lipid PanelNo results found for: CHOL, TRIG, HDL, CHOLHDL, VLDL, LDLCALC HgbA1C No results found for: HGBA1C  Urine Drug Screen:  No results found for: LABOPIA, COCAINSCRNUR, LABBENZ, AMPHETMU, THCU, LABBARB  Alcohol Level: No results for input(s): ETH in the last 168 hours.  Ct Head Wo Contrast  06/21/2015  CLINICAL DATA:  Seizure. EXAM: CT HEAD WITHOUT CONTRAST TECHNIQUE: Contiguous axial images were obtained from the base of the skull through the vertex without intravenous contrast. COMPARISON:  None. FINDINGS: Within the left posterior parietal lobe there is a hyperdense mass with surrounding low attenuation edema. The mass measures 5 x 3.3 cm. This is compatible with an area of hemorrhagic infarct. There is prominence of the sulci and ventricles consistent with brain atrophy. Diffuse low attenuation within the subcortical and periventricular white matter is identified consistent with chronic microvascular disease. Chronic lacunar infarct is identified within the right basal ganglia. There is no  midline shift. No intraventricular hemorrhage. But the paranasal sinuses and mastoid air cells are clear. The calvarium is intact. IMPRESSION: 1. Left posterior parietal parenchymal hematoma is identified. Findings compatible with hemorrhagic infarct. Underlying lesion not excluded. Electronically Signed   By: Signa Kellaylor  Stroud M.D.   On: 06/21/2015 14:34   Mr Maxine GlennMra Head Wo Contrast  06/22/2015  CLINICAL DATA:  Initial evaluation for acute intracranial hemorrhage, seizure. EXAM: MRI HEAD WITHOUT CONTRAST MRA HEAD WITHOUT CONTRAST TECHNIQUE: Multiplanar, multiecho pulse sequences of the brain and surrounding structures were obtained without intravenous contrast. Angiographic images of the head were obtained using MRA technique without contrast. COMPARISON:  Prior CT from 06/21/2015. FINDINGS: MRI HEAD FINDINGS Previously identified large lobar parenchymal hemorrhage centered at the left parietal lobe again seen. This measures 3.8 x 4.4 x 4.5 cm. Internal fluid fluid level present. Surrounding vasogenic edema with partial effacement on the atrium of the left lateral ventricle. Regional sulcal effacement. No hydrocephalus. Visualized distal cervical segments of the internal carotid arteries are patent with antegrade flow. Petrous, cavernous, and supraclinoid segments are widely patent. M1 segments Basilar cisterns are patent. No significant uncal herniation. No underlying mass lesion identified on this noncontrast examination. Abnormal T2/FLAIR signal with associated restricted diffusion present within the mesial left temporal lobe/left hippocampus, likely related to seizure. No acute vascular infarct. Intracranial vascular flow voids are preserved. Gray-white matter differentiation otherwise maintained. Diffuse prominence of the CSF containing spaces is compatible with generalized cerebral atrophy. Patchy T2/FLAIR hyperintensity within the periventricular white matter most consistent with chronic small vessel ischemic  disease, mild for age. Small remote lacunar infarct within the periventricular white matter of the right corona radiata. Additional small remote lacunar infarcts within the bilateral thalami. Scattered foci of susceptibility artifact within the supratentorial and infratentorial brain on gradient echo sequence, centered around the thalami and and cerebellum present, likely small chronic micro hemorrhages, and most likely related to underlying chronic hypertension. No mass lesion identified.  No extra-axial fluid collection. Craniocervical junction within normal limits. Multilevel degenerative spondylolysis present within the visualized upper cervical spine. Pituitary gland within normal limits. No acute abnormality about the orbits. Sequela prior bilateral lens extraction noted. Scattered mucosal thickening within the maxillary sinuses and ethmoidal air cells. Small amount of opacity within the left mastoid air cells. Inner ear structures grossly normal. Bone marrow signal intensity within normal limits. No scalp soft tissue abnormality. MRA HEAD FINDINGS ANTERIOR CIRCULATION: Visualized distal cervical segments of the internal carotid arteries are widely patent with antegrade flow. The petrous, cavernous, and supraclinoid segments are widely patent. A1 segments, anterior communicating artery, and anterior cerebral arteries are well opacified and normal in appearance. M1 segments widely patent without stenosis or occlusion. MCA bifurcations normal. Distal MCA branches well opacified and symmetric. Possible mild short-segment stenosis within a proximal right M2 branch noted. No vascular malformation seen underlying the left parietal hematoma. POSTERIOR  CIRCULATION: Vertebral arteries are patent to the vertebrobasilar junction. Posterior inferior cerebral artery patent on the right. Left PICA not visualized. Basilar artery widely patent. Superior cerebellar arteries well opacified bilaterally. Both posterior cerebral  arteries arise from the basilar artery and are well opacified to their distal aspects. No aneurysm. IMPRESSION: MRI HEAD IMPRESSION: 1. 3.8 x 4.4 x 4.5 cm lobe large parenchymal hematoma within the left parietal lobe. There is mild localized vasogenic edema with partial effacement of the left lateral ventricle and trace left-to-right shift. No underlying mass lesion or vascular malformation identified. In the absence of trauma, primary differential consideration includes cerebral amyloid angiopathy. 2. Abnormal FLAIR and diffusion signal abnormality within the mesial left temporal lobe/left hippocampus, most likely related to seizure. Correlation with EEG recommended. 3. Scattered subcentimeter chronic micro hemorrhages involving the supratentorial and infratentorial brain, most likely related to chronic underlying hypertension. 4. Atrophy with mild chronic small vessel ischemic disease and scattered remote lacunar infarcts as above. MRA HEAD IMPRESSION: Negative intracranial MRA. No vascular malformation or aneurysm to account for left parietal lobar hematoma. Electronically Signed   By: Rise Mu M.D.   On: 06/22/2015 03:04   Mr Brain Wo Contrast  06/22/2015  CLINICAL DATA:  Initial evaluation for acute intracranial hemorrhage, seizure. EXAM: MRI HEAD WITHOUT CONTRAST MRA HEAD WITHOUT CONTRAST TECHNIQUE: Multiplanar, multiecho pulse sequences of the brain and surrounding structures were obtained without intravenous contrast. Angiographic images of the head were obtained using MRA technique without contrast. COMPARISON:  Prior CT from 06/21/2015. FINDINGS: MRI HEAD FINDINGS Previously identified large lobar parenchymal hemorrhage centered at the left parietal lobe again seen. This measures 3.8 x 4.4 x 4.5 cm. Internal fluid fluid level present. Surrounding vasogenic edema with partial effacement on the atrium of the left lateral ventricle. Regional sulcal effacement. No hydrocephalus. Visualized  distal cervical segments of the internal carotid arteries are patent with antegrade flow. Petrous, cavernous, and supraclinoid segments are widely patent. M1 segments Basilar cisterns are patent. No significant uncal herniation. No underlying mass lesion identified on this noncontrast examination. Abnormal T2/FLAIR signal with associated restricted diffusion present within the mesial left temporal lobe/left hippocampus, likely related to seizure. No acute vascular infarct. Intracranial vascular flow voids are preserved. Gray-white matter differentiation otherwise maintained. Diffuse prominence of the CSF containing spaces is compatible with generalized cerebral atrophy. Patchy T2/FLAIR hyperintensity within the periventricular white matter most consistent with chronic small vessel ischemic disease, mild for age. Small remote lacunar infarct within the periventricular white matter of the right corona radiata. Additional small remote lacunar infarcts within the bilateral thalami. Scattered foci of susceptibility artifact within the supratentorial and infratentorial brain on gradient echo sequence, centered around the thalami and and cerebellum present, likely small chronic micro hemorrhages, and most likely related to underlying chronic hypertension. No mass lesion identified.  No extra-axial fluid collection. Craniocervical junction within normal limits. Multilevel degenerative spondylolysis present within the visualized upper cervical spine. Pituitary gland within normal limits. No acute abnormality about the orbits. Sequela prior bilateral lens extraction noted. Scattered mucosal thickening within the maxillary sinuses and ethmoidal air cells. Small amount of opacity within the left mastoid air cells. Inner ear structures grossly normal. Bone marrow signal intensity within normal limits. No scalp soft tissue abnormality. MRA HEAD FINDINGS ANTERIOR CIRCULATION: Visualized distal cervical segments of the internal  carotid arteries are widely patent with antegrade flow. The petrous, cavernous, and supraclinoid segments are widely patent. A1 segments, anterior communicating artery, and anterior cerebral arteries are well opacified and  normal in appearance. M1 segments widely patent without stenosis or occlusion. MCA bifurcations normal. Distal MCA branches well opacified and symmetric. Possible mild short-segment stenosis within a proximal right M2 branch noted. No vascular malformation seen underlying the left parietal hematoma. POSTERIOR CIRCULATION: Vertebral arteries are patent to the vertebrobasilar junction. Posterior inferior cerebral artery patent on the right. Left PICA not visualized. Basilar artery widely patent. Superior cerebellar arteries well opacified bilaterally. Both posterior cerebral arteries arise from the basilar artery and are well opacified to their distal aspects. No aneurysm. IMPRESSION: MRI HEAD IMPRESSION: 1. 3.8 x 4.4 x 4.5 cm lobe large parenchymal hematoma within the left parietal lobe. There is mild localized vasogenic edema with partial effacement of the left lateral ventricle and trace left-to-right shift. No underlying mass lesion or vascular malformation identified. In the absence of trauma, primary differential consideration includes cerebral amyloid angiopathy. 2. Abnormal FLAIR and diffusion signal abnormality within the mesial left temporal lobe/left hippocampus, most likely related to seizure. Correlation with EEG recommended. 3. Scattered subcentimeter chronic micro hemorrhages involving the supratentorial and infratentorial brain, most likely related to chronic underlying hypertension. 4. Atrophy with mild chronic small vessel ischemic disease and scattered remote lacunar infarcts as above. MRA HEAD IMPRESSION: Negative intracranial MRA. No vascular malformation or aneurysm to account for left parietal lobar hematoma. Electronically Signed   By: Rise Mu M.D.   On:  06/22/2015 03:04   Dg Chest Port 1 View  06/22/2015  CLINICAL DATA:  81 year old male with respiratory failure. EXAM: PORTABLE CHEST 1 VIEW COMPARISON:  06/21/2015 FINDINGS: An endotracheal tube is noted with tip at the carina - recommend 2 cm retraction. Right upper lung volume loss is noted medial right apical opacity. There is no evidence of pleural effusion, pneumothorax or pulmonary edema. No acute bony abnormalities are identified. IMPRESSION: Endotracheal tube with tip at the carina - recommend 2 cm retraction. Continued right upper lobe opacity with volume loss. Although this may represent atelectasis/scarring, chest CT with contrast is recommended if no remote outside studies are available to assess stability. Results were called by telephone at the time of interpretation on 06/22/2015 at 8:41 am to Dr. Craige Cotta , who verbally acknowledged these results. Electronically Signed   By: Harmon Pier M.D.   On: 06/22/2015 08:42   Dg Chest Port 1 View  06/21/2015  CLINICAL DATA:  Status post intubation EXAM: PORTABLE CHEST 1 VIEW COMPARISON:  None. FINDINGS: The ET tube tip is at the level of the carina and is directed towards the right mainstem bronchus. Consider retracting tube by 1-2 cm. Heart size is normal. There is no pleural fluid. Asymmetric opacification of the right apex and right upper lobe scar or atelectasis is noted. IMPRESSION: 1. ET tube tip is at the carina and directed towards the right mainstem bronchus. Consider retracting by 1-2 cm. 2. Asymmetric opacification of the right apex, etiology uncertain. Suggest further evaluation with CT chest as patient's clinical condition tolerates. These results will be called to the ordering clinician or representative by the Radiologist Assistant, and communication documented in the PACS or zVision Dashboard. Electronically Signed   By: Signa Kell M.D.   On: 06/21/2015 15:18    CT of the brain  Left posterior parietal parenchymal hematoma is  identified. Findings compatible with hemorrhagic infarct. Underlying lesion not excluded.  MRI of the brain  3.8 x 4.4 x 4.5 cm lobe large parenchymal hematoma within the left parietal lobe. There is mild localized vasogenic edema with partial effacement of  the left lateral ventricle and trace left-to-right shift. No underlying mass lesion or vascular malformation identified. In the absence of trauma, primary differential consideration includes cerebral amyloid angiopathy  MRA of the brain    Carotid Doppler  Pending   2D Echocardiogram  Pending   CXR     Therapy Recommendations pending   Neurological Examination Mental Status: Sedated not following commands.  Cranial Nerves: II: no response to visual threatens. bl post surgical pupils.  III,IV, VI: ptosis not present, extra-ocular motions intact bilaterally  Motor: Withdraws L side > R side. Not following commands.  Tone and bulk:normal tone throughout; no atrophy noted Sensory: Unable to test Deep Tendon Reflexes: 1+ and symmetric throughout Plantars: Right: downgoing   Left: downgoing Cerebellar: Not tested  Gait: not tested.       ASSESSMENT 81 y.o. male w59ith a past medical history significant for alcohol abuse and otherwise unknown PMH, brought in for evaluation of mental state changes. Patient is currently intubated and thus all clinical information was obtained from the medical record. He was taken to the ED " after his family noticed behavior changes. Last seen normal last night, then this morning was noted to be confused, putting shoes on the wrong feet, garbled speech. Had rapidly declining mental status in ER, requiring intubation, CT head revealed large L ICH. Had one witnessed seizure". Patient was intubated for airway protection. Loaded with 1 gram IV keppra.  ICH: - Cortical, not likely HTN related - diff includes dural fistula, AVM, amyloid angiopathy, tumor less likely  - MRI MRA pending - SBP  <160 - SCDs DVT prophylaxis  Addendum: MRI brain 3.8 x 4.4 x 4.5 cm lobe large parenchymal hematoma within the left parietal lobe. There is mild localized vasogenic edema with partial effacement of the left lateral ventricle and trace left-to-right shift. No underlying mass lesion or vascular malformation identified. In the absence of trauma, primary differential consideration includes cerebral amyloid angiopathy  Seizures: - EEG obtain tomorrow - on Keppra  Sedation: - on profopofl with PRN fentanyl and versed - will start precedex as hx of ETOH use with titration down of propofol  ETOH use - thiamine  - folate  Family: - s/p family discussion at bedside - con't aggressive care and full code.  This patient is critically ill and at significant risk of neurological worsening, death and care requires constant monitoring of vital signs, hemodynamics,respiratory and cardiac monitoring, extensive review of multiple databases, frequent neurological assessment, discussion with family, other specialists and medical decision making of high complexity. High risk of further hemorrhage, seizures, hemodynamic instability. 70 minutes of critical care spent.   Hospital day # 1 Pauletta Browns  SIGNED    To contact Stroke Continuity provider, please refer to [email or web address]. After hours, contact General Neurology

## 2015-06-22 NOTE — Progress Notes (Signed)
PULMONARY / CRITICAL CARE MEDICINE   Name: Charles Bullock MRN: [id number] DOB: [birth year]    ADMISSION DATE:  06/21/2015  REFERRING MD:  ER  CHIEF COMPLAINT:  Altered mental status  SUBJECTIVE:  Difficulty with agitation overnight.  VITAL SIGNS: BP 120/99 mmHg  Pulse 78  Temp(Src) 97.2 F (36.2 C) (Axillary)  Resp 18  Ht 5\' 5"  (1.651 m)  Wt 125 lb (56.7 kg)  BMI 20.80 kg/m2  SpO2 100%  VENTILATOR SETTINGS: Vent Mode:  [-] PRVC FiO2 (%):  [40 %] 40 % Set Rate:  [18 bmp] 18 bmp Vt Set:  [500 mL] 500 mL PEEP:  [5 cmH20] 5 cmH20 Plateau Pressure:  [13 cmH20-14 cmH20] 14 cmH20  INTAKE / OUTPUT: I/O last 3 completed shifts: In: 1174.2 [I.V.:1069.2; IV Piggyback:105] Out: 300 [Urine:300]  PHYSICAL EXAMINATION: General:  sedated Neuro:  RASS -3, moves extremities HEENT:  Anisocoria Rt eye, Lt eye pupil pinpoint Cardiovascular:  Regular, no murmur Lungs:  Scattered rhonchi Abdomen:  Soft, non tender Musculoskeletal:  No edema Skin:  No rashes  LABS:  BMET  Recent Labs Lab 06/21/15 1400 06/21/15 1440 06/22/15 0315  NA 136 138 134*  K 4.0 4.0 4.1  CL 101 98* 101  CO2 28  --  24  BUN 11 14 10   CREATININE 0.68 0.70 0.60*  GLUCOSE 141* 141* 151*    Electrolytes  Recent Labs Lab 06/21/15 1400 06/22/15 0315  CALCIUM 9.0 9.1    CBC  Recent Labs Lab 06/21/15 1400 06/21/15 1440 06/22/15 0315  WBC 16.1*  --  12.5*  HGB 15.4 17.3* 14.6  HCT 46.7 51.0 43.3  PLT 254  --  255    Coag's  Recent Labs Lab 06/21/15 1400  APTT 30  INR 1.11    ABG  Recent Labs Lab 06/22/15 0425  PHART 7.485*  PCO2ART 29.3*  PO2ART 135*    Liver Enzymes  Recent Labs Lab 06/21/15 1400  AST 39  ALT 24  ALKPHOS 78  BILITOT 1.0  ALBUMIN 3.7    Glucose  Recent Labs Lab 06/21/15 1331  GLUCAP 154*    Imaging Ct Head Wo Contrast  06/21/2015  CLINICAL DATA:  Seizure. EXAM: CT HEAD WITHOUT CONTRAST TECHNIQUE: Contiguous axial images were  obtained from the base of the skull through the vertex without intravenous contrast. COMPARISON:  None. FINDINGS: Within the left posterior parietal lobe there is a hyperdense mass with surrounding low attenuation edema. The mass measures 5 x 3.3 cm. This is compatible with an area of hemorrhagic infarct. There is prominence of the sulci and ventricles consistent with brain atrophy. Diffuse low attenuation within the subcortical and periventricular white matter is identified consistent with chronic microvascular disease. Chronic lacunar infarct is identified within the right basal ganglia. There is no midline shift. No intraventricular hemorrhage. But the paranasal sinuses and mastoid air cells are clear. The calvarium is intact. IMPRESSION: 1. Left posterior parietal parenchymal hematoma is identified. Findings compatible with hemorrhagic infarct. Underlying lesion not excluded. Electronically Signed   By: Signa Kell M.D.   On: 06/21/2015 14:34   Mr Maxine Glenn Head Wo Contrast  06/22/2015  CLINICAL DATA:  Initial evaluation for acute intracranial hemorrhage, seizure. EXAM: MRI HEAD WITHOUT CONTRAST MRA HEAD WITHOUT CONTRAST TECHNIQUE: Multiplanar, multiecho pulse sequences of the brain and surrounding structures were obtained without intravenous contrast. Angiographic images of the head were obtained using MRA technique without contrast. COMPARISON:  Prior CT from 06/21/2015. FINDINGS: MRI HEAD FINDINGS Previously identified large lobar parenchymal  hemorrhage centered at the left parietal lobe again seen. This measures 3.8 x 4.4 x 4.5 cm. Internal fluid fluid level present. Surrounding vasogenic edema with partial effacement on the atrium of the left lateral ventricle. Regional sulcal effacement. No hydrocephalus. Visualized distal cervical segments of the internal carotid arteries are patent with antegrade flow. Petrous, cavernous, and supraclinoid segments are widely patent. M1 segments Basilar cisterns are  patent. No significant uncal herniation. No underlying mass lesion identified on this noncontrast examination. Abnormal T2/FLAIR signal with associated restricted diffusion present within the mesial left temporal lobe/left hippocampus, likely related to seizure. No acute vascular infarct. Intracranial vascular flow voids are preserved. Gray-white matter differentiation otherwise maintained. Diffuse prominence of the CSF containing spaces is compatible with generalized cerebral atrophy. Patchy T2/FLAIR hyperintensity within the periventricular white matter most consistent with chronic small vessel ischemic disease, mild for age. Small remote lacunar infarct within the periventricular white matter of the right corona radiata. Additional small remote lacunar infarcts within the bilateral thalami. Scattered foci of susceptibility artifact within the supratentorial and infratentorial brain on gradient echo sequence, centered around the thalami and and cerebellum present, likely small chronic micro hemorrhages, and most likely related to underlying chronic hypertension. No mass lesion identified.  No extra-axial fluid collection. Craniocervical junction within normal limits. Multilevel degenerative spondylolysis present within the visualized upper cervical spine. Pituitary gland within normal limits. No acute abnormality about the orbits. Sequela prior bilateral lens extraction noted. Scattered mucosal thickening within the maxillary sinuses and ethmoidal air cells. Small amount of opacity within the left mastoid air cells. Inner ear structures grossly normal. Bone marrow signal intensity within normal limits. No scalp soft tissue abnormality. MRA HEAD FINDINGS ANTERIOR CIRCULATION: Visualized distal cervical segments of the internal carotid arteries are widely patent with antegrade flow. The petrous, cavernous, and supraclinoid segments are widely patent. A1 segments, anterior communicating artery, and anterior cerebral  arteries are well opacified and normal in appearance. M1 segments widely patent without stenosis or occlusion. MCA bifurcations normal. Distal MCA branches well opacified and symmetric. Possible mild short-segment stenosis within a proximal right M2 branch noted. No vascular malformation seen underlying the left parietal hematoma. POSTERIOR CIRCULATION: Vertebral arteries are patent to the vertebrobasilar junction. Posterior inferior cerebral artery patent on the right. Left PICA not visualized. Basilar artery widely patent. Superior cerebellar arteries well opacified bilaterally. Both posterior cerebral arteries arise from the basilar artery and are well opacified to their distal aspects. No aneurysm. IMPRESSION: MRI HEAD IMPRESSION: 1. 3.8 x 4.4 x 4.5 cm lobe large parenchymal hematoma within the left parietal lobe. There is mild localized vasogenic edema with partial effacement of the left lateral ventricle and trace left-to-right shift. No underlying mass lesion or vascular malformation identified. In the absence of trauma, primary differential consideration includes cerebral amyloid angiopathy. 2. Abnormal FLAIR and diffusion signal abnormality within the mesial left temporal lobe/left hippocampus, most likely related to seizure. Correlation with EEG recommended. 3. Scattered subcentimeter chronic micro hemorrhages involving the supratentorial and infratentorial brain, most likely related to chronic underlying hypertension. 4. Atrophy with mild chronic small vessel ischemic disease and scattered remote lacunar infarcts as above. MRA HEAD IMPRESSION: Negative intracranial MRA. No vascular malformation or aneurysm to account for left parietal lobar hematoma. Electronically Signed   By: Rise Mu M.D.   On: 06/22/2015 03:04   Mr Brain Wo Contrast  06/22/2015  CLINICAL DATA:  Initial evaluation for acute intracranial hemorrhage, seizure. EXAM: MRI HEAD WITHOUT CONTRAST MRA HEAD WITHOUT CONTRAST  TECHNIQUE:  Multiplanar, multiecho pulse sequences of the brain and surrounding structures were obtained without intravenous contrast. Angiographic images of the head were obtained using MRA technique without contrast. COMPARISON:  Prior CT from 06/21/2015. FINDINGS: MRI HEAD FINDINGS Previously identified large lobar parenchymal hemorrhage centered at the left parietal lobe again seen. This measures 3.8 x 4.4 x 4.5 cm. Internal fluid fluid level present. Surrounding vasogenic edema with partial effacement on the atrium of the left lateral ventricle. Regional sulcal effacement. No hydrocephalus. Visualized distal cervical segments of the internal carotid arteries are patent with antegrade flow. Petrous, cavernous, and supraclinoid segments are widely patent. M1 segments Basilar cisterns are patent. No significant uncal herniation. No underlying mass lesion identified on this noncontrast examination. Abnormal T2/FLAIR signal with associated restricted diffusion present within the mesial left temporal lobe/left hippocampus, likely related to seizure. No acute vascular infarct. Intracranial vascular flow voids are preserved. Gray-white matter differentiation otherwise maintained. Diffuse prominence of the CSF containing spaces is compatible with generalized cerebral atrophy. Patchy T2/FLAIR hyperintensity within the periventricular white matter most consistent with chronic small vessel ischemic disease, mild for age. Small remote lacunar infarct within the periventricular white matter of the right corona radiata. Additional small remote lacunar infarcts within the bilateral thalami. Scattered foci of susceptibility artifact within the supratentorial and infratentorial brain on gradient echo sequence, centered around the thalami and and cerebellum present, likely small chronic micro hemorrhages, and most likely related to underlying chronic hypertension. No mass lesion identified.  No extra-axial fluid collection.  Craniocervical junction within normal limits. Multilevel degenerative spondylolysis present within the visualized upper cervical spine. Pituitary gland within normal limits. No acute abnormality about the orbits. Sequela prior bilateral lens extraction noted. Scattered mucosal thickening within the maxillary sinuses and ethmoidal air cells. Small amount of opacity within the left mastoid air cells. Inner ear structures grossly normal. Bone marrow signal intensity within normal limits. No scalp soft tissue abnormality. MRA HEAD FINDINGS ANTERIOR CIRCULATION: Visualized distal cervical segments of the internal carotid arteries are widely patent with antegrade flow. The petrous, cavernous, and supraclinoid segments are widely patent. A1 segments, anterior communicating artery, and anterior cerebral arteries are well opacified and normal in appearance. M1 segments widely patent without stenosis or occlusion. MCA bifurcations normal. Distal MCA branches well opacified and symmetric. Possible mild short-segment stenosis within a proximal right M2 branch noted. No vascular malformation seen underlying the left parietal hematoma. POSTERIOR CIRCULATION: Vertebral arteries are patent to the vertebrobasilar junction. Posterior inferior cerebral artery patent on the right. Left PICA not visualized. Basilar artery widely patent. Superior cerebellar arteries well opacified bilaterally. Both posterior cerebral arteries arise from the basilar artery and are well opacified to their distal aspects. No aneurysm. IMPRESSION: MRI HEAD IMPRESSION: 1. 3.8 x 4.4 x 4.5 cm lobe large parenchymal hematoma within the left parietal lobe. There is mild localized vasogenic edema with partial effacement of the left lateral ventricle and trace left-to-right shift. No underlying mass lesion or vascular malformation identified. In the absence of trauma, primary differential consideration includes cerebral amyloid angiopathy. 2. Abnormal FLAIR and  diffusion signal abnormality within the mesial left temporal lobe/left hippocampus, most likely related to seizure. Correlation with EEG recommended. 3. Scattered subcentimeter chronic micro hemorrhages involving the supratentorial and infratentorial brain, most likely related to chronic underlying hypertension. 4. Atrophy with mild chronic small vessel ischemic disease and scattered remote lacunar infarcts as above. MRA HEAD IMPRESSION: Negative intracranial MRA. No vascular malformation or aneurysm to account for left parietal lobar hematoma. Electronically Signed  By: Rise Mu M.D.   On: 06/22/2015 03:04   Dg Chest Port 1 View  06/21/2015  CLINICAL DATA:  Status post intubation EXAM: PORTABLE CHEST 1 VIEW COMPARISON:  None. FINDINGS: The ET tube tip is at the level of the carina and is directed towards the right mainstem bronchus. Consider retracting tube by 1-2 cm. Heart size is normal. There is no pleural fluid. Asymmetric opacification of the right apex and right upper lobe scar or atelectasis is noted. IMPRESSION: 1. ET tube tip is at the carina and directed towards the right mainstem bronchus. Consider retracting by 1-2 cm. 2. Asymmetric opacification of the right apex, etiology uncertain. Suggest further evaluation with CT chest as patient's clinical condition tolerates. These results will be called to the ordering clinician or representative by the Radiologist Assistant, and communication documented in the PACS or zVision Dashboard. Electronically Signed   By: Signa Kell M.D.   On: 06/21/2015 15:18     STUDIES:  12/17 CT head >> Lt posterior parietal hematoma 12/18 MRI/MRA brain >> 4.5 cm Lt parietal lobe hematoma, mild vasogenic edema, abnormal FLAIR and diffusion signal abnormality within the mesial left temporal lobe/left hippocampus  CULTURES:  ANTIBIOTICS:  SIGNIFICANT EVENTS: 12/17 Admit, neurology consulted  LINES/TUBES: 12/17 ETT >>  DISCUSSION: 81 yo male  with reported hx of ETOH presented with acute encephalopathy from ICH, seizure, and HTN emergency.  ASSESSMENT / PLAN:  NEUROLOGIC A:   Lt parietal ICH. Hx of ETOH with agitated delirium. P:   RASS goal: -1 Diprivan with prn fentanyl, versed AEDs per neurology Thiamine, folic acid  PULMONARY A: Compromised airway in setting of ICH. P:   Pressure support wean as tolerated >> mental status barrier to extubation  CARDIOVASCULAR A:  HTN emergency. P:  SBP goal < 160  RENAL A:   No acute issues. P:   Monitor renal fx, urine outpt, electrolytes  GASTROINTESTINAL A:   Nutrition. P:   Tube feeds if unable to extubate soon Protonix for SUP  HEMATOLOGIC A:   No acute issues. P:  F/u CBC SCD's for DVT prevention  INFECTIOUS A:   No acute issues. P:   Monitor clinically  ENDOCRINE A:   No acute issues. P:   Monitor blood sugar on BMET  Goals of Care >> To be determined by neuro prognostication.  Currently, family would be in favor of aggressive care.  CC time 34 minutes.  Coralyn Helling, MD [facility] Pulmonary/Critical Care 06/22/2015, 8:47 AM Pager:  [phone] After 3pm call: [phone]

## 2015-06-22 NOTE — Progress Notes (Signed)
Patient transported on ventilator to MRI and back to 18M-11 without complications.

## 2015-06-22 NOTE — Progress Notes (Signed)
RT retracted tube by 2cm. RT will continue to monitor.

## 2015-06-23 ENCOUNTER — Inpatient Hospital Stay (HOSPITAL_COMMUNITY): Payer: Medicare Other

## 2015-06-23 DIAGNOSIS — G936 Cerebral edema: Secondary | ICD-10-CM

## 2015-06-23 DIAGNOSIS — G40109 Localization-related (focal) (partial) symptomatic epilepsy and epileptic syndromes with simple partial seizures, not intractable, without status epilepticus: Secondary | ICD-10-CM

## 2015-06-23 DIAGNOSIS — J9601 Acute respiratory failure with hypoxia: Secondary | ICD-10-CM

## 2015-06-23 LAB — BASIC METABOLIC PANEL
ANION GAP: 6 (ref 5–15)
BUN: 14 mg/dL (ref 6–20)
CALCIUM: 9 mg/dL (ref 8.9–10.3)
CHLORIDE: 107 mmol/L (ref 101–111)
CO2: 25 mmol/L (ref 22–32)
Creatinine, Ser: 0.63 mg/dL (ref 0.61–1.24)
GFR calc non Af Amer: >60 mL/min (ref >60)
Glucose, Bld: 102 mg/dL — ABNORMAL HIGH (ref 65–99)
POTASSIUM: 3.9 mmol/L (ref 3.5–5.1)
Sodium: 138 mmol/L (ref 135–145)

## 2015-06-23 LAB — GLUCOSE, CAPILLARY
GLUCOSE-CAPILLARY: 101 mg/dL — AB (ref 65–99)
GLUCOSE-CAPILLARY: 116 mg/dL — AB (ref 65–99)
Glucose-Capillary: 105 mg/dL — ABNORMAL HIGH (ref 65–99)
Glucose-Capillary: 112 mg/dL — ABNORMAL HIGH (ref 65–99)
Glucose-Capillary: 122 mg/dL — ABNORMAL HIGH (ref 65–99)
Glucose-Capillary: 124 mg/dL — ABNORMAL HIGH (ref 65–99)

## 2015-06-23 LAB — CBC
HEMATOCRIT: 43.6 % (ref 39.0–52.0)
HEMOGLOBIN: 14.4 g/dL (ref 13.0–17.0)
MCH: 29.3 pg (ref 26.0–34.0)
MCHC: 33 g/dL (ref 30.0–36.0)
MCV: 88.6 fL (ref 78.0–100.0)
Platelets: 268 10*3/uL (ref 150–400)
RBC: 4.92 MIL/uL (ref 4.22–5.81)
RDW: 13.3 % (ref 11.5–15.5)
WBC: 15.2 10*3/uL — ABNORMAL HIGH (ref 4.0–10.5)

## 2015-06-23 MED ORDER — DEXMEDETOMIDINE HCL IN NACL 400 MCG/100ML IV SOLN
0.0000 ug/kg/h | INTRAVENOUS | Status: DC
  Administered 2015-06-23: 1.8 ug/kg/h via INTRAVENOUS
  Administered 2015-06-24: 1.5 ug/kg/h via INTRAVENOUS
  Administered 2015-06-24: 0.4 ug/kg/h via INTRAVENOUS
  Administered 2015-06-24: 1.3 ug/kg/h via INTRAVENOUS
  Administered 2015-06-24: 1.7 ug/kg/h via INTRAVENOUS
  Administered 2015-06-24: 1.8 ug/kg/h via INTRAVENOUS
  Filled 2015-06-23 (×6): qty 100

## 2015-06-23 MED ORDER — DEXMEDETOMIDINE HCL IN NACL 200 MCG/50ML IV SOLN
0.0000 ug/kg/h | INTRAVENOUS | Status: DC
  Administered 2015-06-23: 0.8 ug/kg/h via INTRAVENOUS
  Administered 2015-06-23: 1.8 ug/kg/h via INTRAVENOUS
  Filled 2015-06-23 (×3): qty 50

## 2015-06-23 MED ORDER — ADULT MULTIVITAMIN W/MINERALS CH
1.0000 | ORAL_TABLET | Freq: Every day | ORAL | Status: DC
  Administered 2015-06-25 – 2015-06-27 (×3): 1 via ORAL
  Filled 2015-06-23 (×3): qty 1

## 2015-06-23 MED ORDER — ADULT MULTIVITAMIN LIQUID CH: 5.0000 mL | Freq: Every day | ORAL | Status: DC

## 2015-06-23 MED ORDER — ADULT MULTIVITAMIN LIQUID CH
5.0000 mL | Freq: Every day | ORAL | Status: DC
  Filled 2015-06-23: qty 5

## 2015-06-23 MED ORDER — PRO-STAT SUGAR FREE PO LIQD: 30.0000 mL | Freq: Two times a day (BID) | ORAL | Status: DC

## 2015-06-23 MED ORDER — VITAL HIGH PROTEIN PO LIQD
1000.0000 mL | ORAL | Status: DC
  Filled 2015-06-23 (×3): qty 1000

## 2015-06-23 NOTE — Progress Notes (Signed)
Stroke Team Progress Note  HISTORY 81 y.o. male with a past medical history significant for alcohol abuse and otherwise unknown PMH, brought in for evaluation of mental state changes. Patient is currently intubated and thus all clinical information was obtained from the medical record. He was taken to the ED " after his family noticed behavior changes. Last seen normal last night, then this morning was noted to be confused, putting shoes on the wrong feet, garbled speech. Had rapidly declining mental status in ER, requiring intubation, CT head revealed large L ICH. Had one witnessed seizure". Patient was intubated for airway protection. Loaded with 1 gram IV keppra.  SUBJECTIVE Intubated, agitated, on propofol.  Arousable and remote to localize and all 4 extremities. Blood pressure adequately controlled. Wife at the bedside.  OBJECTIVE Most recent Vital Signs: Filed Vitals:   06/23/15 1100 06/23/15 1118 06/23/15 1200 06/23/15 1222  BP: 121/85  104/82 104/82  Pulse: 97  79 88  Temp:  97.7 F (36.5 C)    TempSrc:  Axillary    Resp: 14  16 15   Height:      Weight:      SpO2: 100%  97% 99%   CBG (last 3)   Recent Labs  06/23/15 0313 06/23/15 0734 06/23/15 1112  GLUCAP 101* 105* 112*    IV Fluid Intake:   . sodium chloride 50 mL/hr at 06/23/15 1200  . dexmedetomidine 1.8 mcg/kg/hr (06/23/15 1500)  . niCARDipine    . propofol (DIPRIVAN) infusion 70 mcg/kg/min (06/23/15 1240)    MEDICATIONS  . antiseptic oral rinse  7 mL Mouth Rinse QID  . chlorhexidine gluconate  15 mL Mouth Rinse BID  . feeding supplement (PRO-STAT SUGAR FREE 64)  30 mL Per Tube BID  . [START ON 06/24/2015] feeding supplement (VITAL HIGH PROTEIN)  1,000 mL Per Tube Q24H  . folic acid  1 mg Intravenous Daily  . Influenza vac split quadrivalent PF  0.5 mL Intramuscular Tomorrow-1000  . levETIRAcetam  500 mg Intravenous Q12H  . multivitamin with minerals  1 tablet Oral Daily  . pantoprazole (PROTONIX) IV   40 mg Intravenous QHS  . pneumococcal 23 valent vaccine  0.5 mL Intramuscular Tomorrow-1000  . senna-docusate  1 tablet Oral BID  . thiamine IV  100 mg Intravenous Daily   PRN:  acetaminophen **OR** acetaminophen, fentaNYL (SUBLIMAZE) injection, labetalol, midazolam  Diet:  Diet NPO time specified  Activity:  Bedrest DVT Prophylaxis:  SCDs  CLINICALLY SIGNIFICANT STUDIES Basic Metabolic Panel:   Recent Labs Lab 06/22/15 0315 06/23/15 0220  NA 134* 138  K 4.1 3.9  CL 101 107  CO2 24 25  GLUCOSE 151* 102*  BUN 10 14  CREATININE 0.60* 0.63  CALCIUM 9.1 9.0   Liver Function Tests:   Recent Labs Lab 06/21/15 1400  AST 39  ALT 24  ALKPHOS 78  BILITOT 1.0  PROT 7.6  ALBUMIN 3.7   CBC:  Recent Labs Lab 06/21/15 1400  06/22/15 0315 06/23/15 0220  WBC 16.1*  --  12.5* 15.2*  NEUTROABS 13.6*  --   --   --   HGB 15.4  < > 14.6 14.4  HCT 46.7  < > 43.3 43.6  MCV 90.3  --  88.0 88.6  PLT 254  --  255 268  < > = values in this interval not displayed. Coagulation:   Recent Labs Lab 06/21/15 1400  LABPROT 14.5  INR 1.11   Cardiac Enzymes: No results for input(s): CKTOTAL, CKMB, CKMBINDEX,  TROPONINI in the last 168 hours. Urinalysis: No results for input(s): COLORURINE, LABSPEC, PHURINE, GLUCOSEU, HGBUR, BILIRUBINUR, KETONESUR, PROTEINUR, UROBILINOGEN, NITRITE, LEUKOCYTESUR in the last 168 hours.  Invalid input(s): APPERANCEUR Lipid PanelNo results found for: CHOL, TRIG, HDL, CHOLHDL, VLDL, LDLCALC HgbA1C No results found for: HGBA1C  Urine Drug Screen:      Component Value Date/Time   LABOPIA NONE DETECTED 06/22/2015 1706   COCAINSCRNUR NONE DETECTED 06/22/2015 1706   LABBENZ POSITIVE* 06/22/2015 1706   AMPHETMU NONE DETECTED 06/22/2015 1706   THCU NONE DETECTED 06/22/2015 1706   LABBARB NONE DETECTED 06/22/2015 1706    Alcohol Level: No results for input(s): ETH in the last 168 hours.  Ct Head Wo Contrast  06/23/2015  CLINICAL DATA:  Altered mental  status ICH.  Followup scan. EXAM: CT HEAD WITHOUT CONTRAST TECHNIQUE: Contiguous axial images were obtained from the base of the skull through the vertex without intravenous contrast. COMPARISON:  Yesterday FINDINGS: Skull and Sinuses:The patient's orogastric tube coils in the nasopharynx, where there is also layering fluid. No traumatic osseous finding.  Clear sinuses and mastoids. Visualized orbits: No contributory finding. Bilateral cataract resection. Brain: Lobar hematoma centered in the left parietal lobe has unchanged size and appearance, including hematocrit level. As before, maximal dimensions measure up to 4.5 cm. There is no subarachnoid or intraventricular extension. Unchanged local mass effect on the ventricular system and sulci, without midline shift or herniation. Stable surrounding rim of vasogenic edema. No evidence of acute infarct.  No hydrocephalus. IMPRESSION: 1. Size stable 45mm hematoma in the left cerebral hemisphere. 2. Redundant orogastric tube, coiled in the nasopharynx. Electronically Signed   By: Marnee Spring M.D.   On: 06/23/2015 00:21   Mr Maxine Glenn Head Wo Contrast  06/22/2015  CLINICAL DATA:  Initial evaluation for acute intracranial hemorrhage, seizure. EXAM: MRI HEAD WITHOUT CONTRAST MRA HEAD WITHOUT CONTRAST TECHNIQUE: Multiplanar, multiecho pulse sequences of the brain and surrounding structures were obtained without intravenous contrast. Angiographic images of the head were obtained using MRA technique without contrast. COMPARISON:  Prior CT from 06/21/2015. FINDINGS: MRI HEAD FINDINGS Previously identified large lobar parenchymal hemorrhage centered at the left parietal lobe again seen. This measures 3.8 x 4.4 x 4.5 cm. Internal fluid fluid level present. Surrounding vasogenic edema with partial effacement on the atrium of the left lateral ventricle. Regional sulcal effacement. No hydrocephalus. Visualized distal cervical segments of the internal carotid arteries are patent  with antegrade flow. Petrous, cavernous, and supraclinoid segments are widely patent. M1 segments Basilar cisterns are patent. No significant uncal herniation. No underlying mass lesion identified on this noncontrast examination. Abnormal T2/FLAIR signal with associated restricted diffusion present within the mesial left temporal lobe/left hippocampus, likely related to seizure. No acute vascular infarct. Intracranial vascular flow voids are preserved. Gray-white matter differentiation otherwise maintained. Diffuse prominence of the CSF containing spaces is compatible with generalized cerebral atrophy. Patchy T2/FLAIR hyperintensity within the periventricular white matter most consistent with chronic small vessel ischemic disease, mild for age. Small remote lacunar infarct within the periventricular white matter of the right corona radiata. Additional small remote lacunar infarcts within the bilateral thalami. Scattered foci of susceptibility artifact within the supratentorial and infratentorial brain on gradient echo sequence, centered around the thalami and and cerebellum present, likely small chronic micro hemorrhages, and most likely related to underlying chronic hypertension. No mass lesion identified.  No extra-axial fluid collection. Craniocervical junction within normal limits. Multilevel degenerative spondylolysis present within the visualized upper cervical spine. Pituitary gland within normal limits. No  acute abnormality about the orbits. Sequela prior bilateral lens extraction noted. Scattered mucosal thickening within the maxillary sinuses and ethmoidal air cells. Small amount of opacity within the left mastoid air cells. Inner ear structures grossly normal. Bone marrow signal intensity within normal limits. No scalp soft tissue abnormality. MRA HEAD FINDINGS ANTERIOR CIRCULATION: Visualized distal cervical segments of the internal carotid arteries are widely patent with antegrade flow. The petrous,  cavernous, and supraclinoid segments are widely patent. A1 segments, anterior communicating artery, and anterior cerebral arteries are well opacified and normal in appearance. M1 segments widely patent without stenosis or occlusion. MCA bifurcations normal. Distal MCA branches well opacified and symmetric. Possible mild short-segment stenosis within a proximal right M2 branch noted. No vascular malformation seen underlying the left parietal hematoma. POSTERIOR CIRCULATION: Vertebral arteries are patent to the vertebrobasilar junction. Posterior inferior cerebral artery patent on the right. Left PICA not visualized. Basilar artery widely patent. Superior cerebellar arteries well opacified bilaterally. Both posterior cerebral arteries arise from the basilar artery and are well opacified to their distal aspects. No aneurysm. IMPRESSION: MRI HEAD IMPRESSION: 1. 3.8 x 4.4 x 4.5 cm lobe large parenchymal hematoma within the left parietal lobe. There is mild localized vasogenic edema with partial effacement of the left lateral ventricle and trace left-to-right shift. No underlying mass lesion or vascular malformation identified. In the absence of trauma, primary differential consideration includes cerebral amyloid angiopathy. 2. Abnormal FLAIR and diffusion signal abnormality within the mesial left temporal lobe/left hippocampus, most likely related to seizure. Correlation with EEG recommended. 3. Scattered subcentimeter chronic micro hemorrhages involving the supratentorial and infratentorial brain, most likely related to chronic underlying hypertension. 4. Atrophy with mild chronic small vessel ischemic disease and scattered remote lacunar infarcts as above. MRA HEAD IMPRESSION: Negative intracranial MRA. No vascular malformation or aneurysm to account for left parietal lobar hematoma. Electronically Signed   By: Rise MuBenjamin  McClintock M.D.   On: 06/22/2015 03:04   Mr Brain Wo Contrast  06/22/2015  CLINICAL DATA:   Initial evaluation for acute intracranial hemorrhage, seizure. EXAM: MRI HEAD WITHOUT CONTRAST MRA HEAD WITHOUT CONTRAST TECHNIQUE: Multiplanar, multiecho pulse sequences of the brain and surrounding structures were obtained without intravenous contrast. Angiographic images of the head were obtained using MRA technique without contrast. COMPARISON:  Prior CT from 06/21/2015. FINDINGS: MRI HEAD FINDINGS Previously identified large lobar parenchymal hemorrhage centered at the left parietal lobe again seen. This measures 3.8 x 4.4 x 4.5 cm. Internal fluid fluid level present. Surrounding vasogenic edema with partial effacement on the atrium of the left lateral ventricle. Regional sulcal effacement. No hydrocephalus. Visualized distal cervical segments of the internal carotid arteries are patent with antegrade flow. Petrous, cavernous, and supraclinoid segments are widely patent. M1 segments Basilar cisterns are patent. No significant uncal herniation. No underlying mass lesion identified on this noncontrast examination. Abnormal T2/FLAIR signal with associated restricted diffusion present within the mesial left temporal lobe/left hippocampus, likely related to seizure. No acute vascular infarct. Intracranial vascular flow voids are preserved. Gray-white matter differentiation otherwise maintained. Diffuse prominence of the CSF containing spaces is compatible with generalized cerebral atrophy. Patchy T2/FLAIR hyperintensity within the periventricular white matter most consistent with chronic small vessel ischemic disease, mild for age. Small remote lacunar infarct within the periventricular white matter of the right corona radiata. Additional small remote lacunar infarcts within the bilateral thalami. Scattered foci of susceptibility artifact within the supratentorial and infratentorial brain on gradient echo sequence, centered around the thalami and and cerebellum present, likely small  chronic micro hemorrhages, and  most likely related to underlying chronic hypertension. No mass lesion identified.  No extra-axial fluid collection. Craniocervical junction within normal limits. Multilevel degenerative spondylolysis present within the visualized upper cervical spine. Pituitary gland within normal limits. No acute abnormality about the orbits. Sequela prior bilateral lens extraction noted. Scattered mucosal thickening within the maxillary sinuses and ethmoidal air cells. Small amount of opacity within the left mastoid air cells. Inner ear structures grossly normal. Bone marrow signal intensity within normal limits. No scalp soft tissue abnormality. MRA HEAD FINDINGS ANTERIOR CIRCULATION: Visualized distal cervical segments of the internal carotid arteries are widely patent with antegrade flow. The petrous, cavernous, and supraclinoid segments are widely patent. A1 segments, anterior communicating artery, and anterior cerebral arteries are well opacified and normal in appearance. M1 segments widely patent without stenosis or occlusion. MCA bifurcations normal. Distal MCA branches well opacified and symmetric. Possible mild short-segment stenosis within a proximal right M2 branch noted. No vascular malformation seen underlying the left parietal hematoma. POSTERIOR CIRCULATION: Vertebral arteries are patent to the vertebrobasilar junction. Posterior inferior cerebral artery patent on the right. Left PICA not visualized. Basilar artery widely patent. Superior cerebellar arteries well opacified bilaterally. Both posterior cerebral arteries arise from the basilar artery and are well opacified to their distal aspects. No aneurysm. IMPRESSION: MRI HEAD IMPRESSION: 1. 3.8 x 4.4 x 4.5 cm lobe large parenchymal hematoma within the left parietal lobe. There is mild localized vasogenic edema with partial effacement of the left lateral ventricle and trace left-to-right shift. No underlying mass lesion or vascular malformation identified. In the  absence of trauma, primary differential consideration includes cerebral amyloid angiopathy. 2. Abnormal FLAIR and diffusion signal abnormality within the mesial left temporal lobe/left hippocampus, most likely related to seizure. Correlation with EEG recommended. 3. Scattered subcentimeter chronic micro hemorrhages involving the supratentorial and infratentorial brain, most likely related to chronic underlying hypertension. 4. Atrophy with mild chronic small vessel ischemic disease and scattered remote lacunar infarcts as above. MRA HEAD IMPRESSION: Negative intracranial MRA. No vascular malformation or aneurysm to account for left parietal lobar hematoma. Electronically Signed   By: Rise Mu M.D.   On: 06/22/2015 03:04   Dg Chest Port 1 View  06/23/2015  CLINICAL DATA:  Respiratory failure, history of intracranial hemorrhage EXAM: PORTABLE CHEST 1 VIEW COMPARISON:  Portable chest x-ray of June 22, 2015 FINDINGS: The lungs are adequately inflated. There is infiltrate in the left lower lobe partially obscuring the hemidiaphragm. There is patchy density in the right suprahilar region. There is no pleural effusion or pneumothorax. The heart and pulmonary vascularity are normal. The endotracheal tube tip lies approximately 2 cm above the carina. The esophagogastric tube tip projects below the inferior margin of the image. The proximal port is just below the level of the GE junction. IMPRESSION: 1. Left lower lobe and right suprahilar interstitial infiltrate compatible with pneumonia. 2. Withdrawal of the endotracheal tube by 2 cm is recommended. Advancement of the nasogastric tube by 5 cm is recommended. Electronically Signed   By: David  [place] M.D.   On: 06/23/2015 08:17   Dg Chest Port 1 View  06/22/2015  CLINICAL DATA:  81 year old male with respiratory failure. EXAM: PORTABLE CHEST 1 VIEW COMPARISON:  06/21/2015 FINDINGS: An endotracheal tube is noted with tip at the carina - recommend 2 cm  retraction. Right upper lung volume loss is noted medial right apical opacity. There is no evidence of pleural effusion, pneumothorax or pulmonary edema. No acute bony abnormalities  are identified. IMPRESSION: Endotracheal tube with tip at the carina - recommend 2 cm retraction. Continued right upper lobe opacity with volume loss. Although this may represent atelectasis/scarring, chest CT with contrast is recommended if no remote outside studies are available to assess stability. Results were called by telephone at the time of interpretation on 06/22/2015 at 8:41 am to Dr. Craige Cotta , who verbally acknowledged these results. Electronically Signed   By: Harmon Pier M.D.   On: 06/22/2015 08:42   Dg Abd Portable 1v  06/22/2015  CLINICAL DATA:  Check NG placement EXAM: PORTABLE ABDOMEN - 1 VIEW COMPARISON:  None. FINDINGS: Gastric catheter is noted within the stomach. Scattered large and small bowel gas is seen. Degenerative changes of lumbar spine are noted. IMPRESSION: Gastric catheter within the stomach. Electronically Signed   By: Alcide Clever M.D.   On: 06/22/2015 11:19    CT of the brain  Left posterior parietal parenchymal hematoma is identified. Findings compatible with hemorrhagic infarct. Underlying lesion not excluded.  MRI of the brain  3.8 x 4.4 x 4.5 cm lobe large parenchymal hematoma within the left parietal lobe. There is mild localized vasogenic edema with partial effacement of the left lateral ventricle and trace left-to-right shift. No underlying mass lesion or vascular malformation identified. In the absence of trauma, primary differential consideration includes cerebral amyloid angiopathy  MRA of the brain  Negative intracranial MRA. No vascular malformation or aneurysm to account for left parietal lobar hematoma  Carotid Doppler  Pending   2D Echocardiogram  Pending   CXR  06/23/15 :Left lower lobe and right suprahilar interstitial infiltrate compatible with pneum   Therapy  Recommendations pending   Neurological Examination Mental Status: Sedated on propofol. Can be aroused with partially open eyes. Spontaneous eye and head movements. not following commands.  Cranial Nerves: II: no response to visual threatens. bl post surgical pupils. Corneal reflexes are present. Fundi could not be visualized. III,IV, VI: ptosis not present, extra-ocular motions intact bilaterally  Motor: Withdraws L side > R side. Not following commands.  Tone and bulk:normal tone throughout; no atrophy noted Sensory: Unable to test Deep Tendon Reflexes: 1+ and symmetric throughout Plantars: Right: downgoing   Left: downgoing Cerebellar: Not tested  Gait: not tested.       ASSESSMENT 81 y.o. male with a past medical history significant for alcohol abuse and otherwise unknown PMH, brought in for evaluation of mental state changes. Patient is currently intubated and thus all clinical information was obtained from the medical record. He was taken to the ED " after his family noticed behavior changes.   Had rapidly declining mental status in ER, requiring intubation, CT head revealed large L ICH. Had one witnessed seizure". Patient was intubated for airway protection. Loaded with 1 gram IV keppra.  ICH: - Cortical, not likely HTN related possibly alcohol or amyloid related   - MRI MRA pending - SBP <160 - SCDs DVT prophylaxis  Addendum: MRI brain 3.8 x 4.4 x 4.5 cm lobe large parenchymal hematoma within the left parietal lobe. There is mild localized vasogenic edema with partial effacement of the left lateral ventricle and trace left-to-right shift. No underlying mass lesion or vascular malformation identified. In the absence of trauma, primary differential consideration includes cerebral amyloid angiopathy  Seizures: - EEG done results pending - on Keppra  Sedation: - on profopofl with PRN fentanyl and versed - will start precedex as hx of ETOH use with titration down  of propofol  ETOH use -  thiamine  - folate  Family: - s/p family discussion at bedside - con't aggressive care and full code. Discussed with Dr. Delton Coombes. Plan to wean sedation and extubate if possible This patient is critically ill and at significant risk of neurological worsening, death and care requires constant monitoring of vital signs, hemodynamics,respiratory and cardiac monitoring, extensive review of multiple databases, frequent neurological assessment, discussion with family, other specialists and medical decision making of high complexity. High risk of further hemorrhage, seizures, hemodynamic instability. 40 minutes of critical care spent.   Hospital day # 2 Lujuana Kapler  SIGNED    To contact Stroke Continuity provider, please refer to [email or web address]. After hours, contact General Neurology

## 2015-06-23 NOTE — Procedures (Signed)
Extubation Procedure Note  Patient Details:   Name: Charles Bullock DOB: October 16, 1926 MRN: [id number]   Airway Documentation:  Airway 7.5 mm (Active)  Secured at (cm) 23 cm 06/23/2015 12:22 PM  Measured From Lips 06/23/2015 12:22 PM  Secured Location Left 06/23/2015 12:22 PM  Secured By [employer] Tube Holder 06/23/2015 12:22 PM  Tube Holder Repositioned Yes 06/23/2015 12:22 PM  Cuff Pressure (cm H2O) 26 cm H2O 06/22/2015  7:56 PM  Site Condition Dry 06/23/2015 12:22 PM    Evaluation  O2 sats: stable throughout Complications: No apparent complications Patient did tolerate procedure well. Bilateral Breath Sounds: Diminished Suctioning: Airway No  Pt extubated to 4l [place], no stridor noted.  RN at bedside.  Charles Bullock 06/23/2015, 1:17 PM

## 2015-06-23 NOTE — Procedures (Addendum)
History: 81 yo M with a history of ICH with seizure/   Sedation: None  Technique: This is a 21 channel routine scalp EEG performed at the bedside with bipolar and monopolar montages arranged in accordance to the international 10/20 system of electrode placement. One channel was dedicated to EKG recording.    Background: The background consists of irregular delta and theta activity as well as a posterior rhythm of 9.5 HZ. This is asymmetric better seen on the right than left. There is also focal delta activity present in the left fronto-temporal region. Sharp waves are seen at F7 > F3, T7 > Fp1. There is attenuation of the frequencies in the left posterior quadrant  Photic stimulation: Physiologic driving is not performed  EEG Abnormalities: 1) Focal left frontotemporal sharp waves 2) focal left frontotemporal delta activity 3) attenuation of frequencies in the left posterior quadrant  Clinical Interpretation: This EEG demonstrates evidence of a potential area of epileptogenicity in the left frontotemporal region.   There was no seizure recorded on this study.   Ritta SlotMcNeill Kirkpatrick, MD [facility] [phone]  If 7pm- 7am, please page neurology on call as listed in AMION.

## 2015-06-23 NOTE — Progress Notes (Signed)
Initial Nutrition Assessment  INTERVENTION:  Continue Vital High Protein @ 25 ml/hr via OGT Provide 30 ml of pro-stat BID  Tube feeding regimen provides 800 kcal, 83 grams of protein, and 504 ml of H2O.  TF regimen plus current rate of propofol will provide 1452 kcal (112% of estimated needs).   If propofol is discontinued, change TF to Vital AF@45  ml/hr to provide 1296 kcal, 81 grams of protein, and 875 ml of water.    NUTRITION DIAGNOSIS:   Inadequate oral intake related to inability to eat as evidenced by NPO status.   GOAL:   Patient will meet greater than or equal to 90% of their needs   MONITOR:   TF tolerance, Vent status, Labs, Skin, I & O's, Weight trends  REASON FOR ASSESSMENT:   Consult, Ventilator Enteral/tube feeding initiation and management  ASSESSMENT:   81 yo spanish speaking male with hx heavy ETOH, otherwise unknown PMH presented 12/17 after his family noticed behavior changes. Was noted to be confused, putting shoes on the wrong feet, garbled speech. Had rapidly declining mental status in ER, requiring intubation, CT head revealed large L ICH. Had one witness seizure, none further noted.   Bedside procedure in progress at time of visit. Pt has OGT in place with Vital High Protein infusing at 30 ml/hr. Propofol running.   TF plus propofol currently provides 1372 kcal (106% estimated needs), 63 grams protein (79% estimated needs), and 605 ml of water.  Labs reviewed.   Patient is currently intubated on ventilator support MV: 7.5 L/min Temp (24hrs), Avg:97.5 F (36.4 C), Min:97 F (36.1 C), Max:98.2 F (36.8 C)  Propofol: 24.7 ml/hr (provides 652 kcal per 24 hours)  Diet Order:  Diet NPO time specified  Skin:  Reviewed, no issues  Last BM:  PTA  Height:   Ht Readings from Last 1 Encounters:  06/21/15 5\' 5"  (1.651 m)    Weight:   Wt Readings from Last 1 Encounters:  06/23/15 124 lb 1.9 oz (56.3 kg)    Ideal Body Weight:  61.8  kg  BMI:  Body mass index is 20.65 kg/(m^2).  Estimated Nutritional Needs:   Kcal:  1295  Protein:  80-90 grams  Fluid:  1.7 L/day  EDUCATION NEEDS:   No education needs identified at this time  Charles Bullock RD, LDN Inpatient Clinical Dietitian Pager: [phone] After Hours Pager: [phone]

## 2015-06-23 NOTE — Progress Notes (Signed)
PULMONARY / CRITICAL CARE MEDICINE   Name: Charles Bullock MRN: [id number] DOB: 1927-02-12    ADMISSION DATE:  06/21/2015  REFERRING MD:  ER  CHIEF COMPLAINT:  Altered mental status  SUBJECTIVE:  NAEON.  EEG this AM, results pending.  Required high doses of propofol overnight due to agitation.  VITAL SIGNS: BP 113/66 mmHg  Pulse 81  Temp(Src) 97.7 F (36.5 C) (Axillary)  Resp 16  Ht  (1.651 m)  Wt 56.3 kg (124 lb 1.9 oz)  BMI 20.65 kg/m2  SpO2 100%  VENTILATOR SETTINGS: Vent Mode:  [-] PRVC FiO2 (%):  [40 %] 40 % Set Rate:  [16 bmp] 16 bmp Vt Set:  [500 mL] 500 mL PEEP:  [5 cmH20] 5 cmH20 Plateau Pressure:  [11 cmH20-18 cmH20] 14 cmH20  INTAKE / OUTPUT: I/O last 3 completed shifts: In: 2237.9 [I.V.:1802.9; NG/GT:120; IV Piggyback:315] Out: 1150 [Urine:1150]  PHYSICAL EXAMINATION: General:  Sedated, agitated. Neuro:  RASS -1, moves extremities HEENT:  Anisocoria Rt eye, Lt eye pupil pinpoint Cardiovascular:  Regular, no murmur Lungs:  Scattered rhonchi Abdomen:  Soft, non tender Musculoskeletal:  No edema Skin:  No rashes  LABS:  BMET  Recent Labs Lab 06/21/15 1400 06/21/15 1440 06/22/15 0315 06/23/15 0220  NA 136 138 134* 138  K 4.0 4.0 4.1 3.9  CL 101 98* 101 107  CO2 28  --  24 25  BUN CREATININE 0.68 0.70 0.60* 0.63  GLUCOSE 141* 141* 151* 102*    Electrolytes  Recent Labs Lab 06/21/15 1400 06/22/15 0315 06/23/15 0220  CALCIUM 9.0 9.1 9.0    CBC  Recent Labs Lab 06/21/15 1400 06/21/15 1440 06/22/15 0315 06/23/15 0220  WBC 16.1*  --  12.5* 15.2*  HGB 15.4 17.3* 14.6 14.4  HCT 46.7 51.0 43.3 43.6  PLT 254  --  255 268    Coag's  Recent Labs Lab 06/21/15 1400  APTT 30  INR 1.11    ABG  Recent Labs Lab 06/22/15 0425  PHART 7.485*  PCO2ART 29.3*  PO2ART 135*    Liver Enzymes  Recent Labs Lab 06/21/15 1400  AST 39  ALT 24  ALKPHOS 78  BILITOT 1.0  ALBUMIN 3.7    Glucose  Recent  Labs Lab 06/21/15 1331 06/22/15 1146 06/22/15 1554 06/22/15 1951 06/23/15 0313 06/23/15 0734  GLUCAP 154* 111* 112* 103* 101* 105*    Imaging Ct Head Wo Contrast  06/23/2015  CLINICAL DATA:  Altered mental status ICH.  Followup scan. EXAM: CT HEAD WITHOUT CONTRAST TECHNIQUE: Contiguous axial images were obtained from the base of the skull through the vertex without intravenous contrast. COMPARISON:  Yesterday FINDINGS: Skull and Sinuses:The patient's orogastric tube coils in the nasopharynx, where there is also layering fluid. No traumatic osseous finding.  Clear sinuses and mastoids. Visualized orbits: No contributory finding. Bilateral cataract resection. Brain: Lobar hematoma centered in the left parietal lobe has unchanged size and appearance, including hematocrit level. As before, maximal dimensions measure up to 4.5 cm. There is no subarachnoid or intraventricular extension. Unchanged local mass effect on the ventricular system and sulci, without midline shift or herniation. Stable surrounding rim of vasogenic edema. No evidence of acute infarct.  No hydrocephalus. IMPRESSION: 1. Size stable 45mm hematoma in the left cerebral hemisphere. 2. Redundant orogastric tube, coiled in the nasopharynx. Electronically Signed   By: Marnee Spring M.D.   On: 06/23/2015 00:21   Dg Chest Port 1 View  06/23/2015  CLINICAL DATA:  Respiratory failure, history of intracranial hemorrhage EXAM: PORTABLE CHEST 1 VIEW COMPARISON:  Portable chest x-ray of June 22, 2015 FINDINGS: The lungs are adequately inflated. There is infiltrate in the left lower lobe partially obscuring the hemidiaphragm. There is patchy density in the right suprahilar region. There is no pleural effusion or pneumothorax. The heart and pulmonary vascularity are normal. The endotracheal tube tip lies approximately 2 cm above the carina. The esophagogastric tube tip projects below the inferior margin of the image. The proximal port is just  below the level of the GE junction. IMPRESSION: 1. Left lower lobe and right suprahilar interstitial infiltrate compatible with pneumonia. 2. Withdrawal of the endotracheal tube by 2 cm is recommended. Advancement of the nasogastric tube by 5 cm is recommended. Electronically Signed   By: David  SwazilandJordan M.D.   On: 06/23/2015 08:17     STUDIES:  12/17 CT head >> Lt posterior parietal hematoma 12/18 MRI/MRA brain >> 4.5 cm Lt parietal lobe hematoma, mild vasogenic edema, abnormal FLAIR and diffusion signal abnormality within the mesial left temporal lobe/left hippocampus EEG 12/19 >  CULTURES:  ANTIBIOTICS:  SIGNIFICANT EVENTS: 12/17 Admit, neurology consulted  LINES/TUBES: 12/17 ETT >>  DISCUSSION: 81 yo male with reported hx of ETOH presented with acute encephalopathy from ICH, seizure, and HTN emergency.  ASSESSMENT / PLAN:  NEUROLOGIC A:  Lt parietal ICH Hx of ETOH with agitated delirium P:   RASS goal: 0 Precedex ordered 12/18 but has not been able to wean off propofol Continue propofol for now, will likely need to decrease and then extubate quickly AEDs per neurology Thiamine, folic acid  PULMONARY A: Compromised airway in setting of ICH P:   PSV trials 12/19. Agitation a barrier. Will  need to lighten propofol and then extubate quickly if meets criteria. He will not perform a long wean  CXR in AM  CARDIOVASCULAR A:  HTN emergency P:  Nicardipine with PRN labetalol for SBP goal < 160  RENAL A:   No acute issues P:   Monitor renal fx, urine outpt, electrolytes  GASTROINTESTINAL A:   Nutrition P:   Continue TF's until extubated Swallow eval once extubated Protonix for SUP  HEMATOLOGIC A:   No acute issues P:  F/u CBC SCD's for DVT prevention  INFECTIOUS A:   No acute issues P:   Monitor clinically  ENDOCRINE A:   No acute issues. P:   Monitor blood sugar on BMET   Rutherford Guysahul Desai, [place] - [facility] Pager:  [phone]  or [phone] 06/23/2015, 12:05 PM   Attending Note:  I have examined patient, reviewed labs, studies and notes. I have discussed the case with Ihor Dow Desai, and I agree with the data and plans as amended above. Encephalopathy due to ICH, now agitated - contributions of EtOH withdrawal, medications, ICH. His resp mechanics are good on my eval. Would like to perform trial extubation. If he remains agitated and requires more sedation then he may need to be reintubated. Will ry precedex once extubated.  Independent critical care time is 40 minutes.   Levy Pupaobert Baylen Buckner, MD, PhD 06/23/2015, 12:57 PM [facility] [phone] or if no answer [phone]

## 2015-06-23 NOTE — Progress Notes (Signed)
EEG Completed; Results Pending  

## 2015-06-24 ENCOUNTER — Inpatient Hospital Stay (HOSPITAL_COMMUNITY): Payer: Medicare Other

## 2015-06-24 DIAGNOSIS — S06350A Traumatic hemorrhage of left cerebrum without loss of consciousness, initial encounter: Secondary | ICD-10-CM

## 2015-06-24 DIAGNOSIS — R569 Unspecified convulsions: Secondary | ICD-10-CM

## 2015-06-24 DIAGNOSIS — I10 Essential (primary) hypertension: Secondary | ICD-10-CM

## 2015-06-24 DIAGNOSIS — F10231 Alcohol dependence with withdrawal delirium: Secondary | ICD-10-CM

## 2015-06-24 LAB — GLUCOSE, CAPILLARY
GLUCOSE-CAPILLARY: 100 mg/dL — AB (ref 65–99)
Glucose-Capillary: 109 mg/dL — ABNORMAL HIGH (ref 65–99)
Glucose-Capillary: 79 mg/dL (ref 65–99)
Glucose-Capillary: 90 mg/dL (ref 65–99)
Glucose-Capillary: 92 mg/dL (ref 65–99)
Glucose-Capillary: 95 mg/dL (ref 65–99)

## 2015-06-24 LAB — PHOSPHORUS: PHOSPHORUS: 2.5 mg/dL (ref 2.5–4.6)

## 2015-06-24 LAB — CBC
HCT: 44.7 % (ref 39.0–52.0)
Hemoglobin: 15 g/dL (ref 13.0–17.0)
MCH: 29.8 pg (ref 26.0–34.0)
MCHC: 33.6 g/dL (ref 30.0–36.0)
MCV: 88.7 fL (ref 78.0–100.0)
PLATELETS: 223 10*3/uL (ref 150–400)
RBC: 5.04 MIL/uL (ref 4.22–5.81)
RDW: 13.2 % (ref 11.5–15.5)
WBC: 14.6 10*3/uL — AB (ref 4.0–10.5)

## 2015-06-24 LAB — BASIC METABOLIC PANEL
Anion gap: 7 (ref 5–15)
BUN: 12 mg/dL (ref 6–20)
CO2: 25 mmol/L (ref 22–32)
CREATININE: 0.55 mg/dL — AB (ref 0.61–1.24)
Calcium: 8.6 mg/dL — ABNORMAL LOW (ref 8.9–10.3)
Chloride: 110 mmol/L (ref 101–111)
Glucose, Bld: 121 mg/dL — ABNORMAL HIGH (ref 65–99)
POTASSIUM: 3.1 mmol/L — AB (ref 3.5–5.1)
SODIUM: 142 mmol/L (ref 135–145)

## 2015-06-24 LAB — MAGNESIUM: Magnesium: 2 mg/dL (ref 1.7–2.4)

## 2015-06-24 MED ORDER — CHLORHEXIDINE GLUCONATE 0.12 % MT SOLN
15.0000 mL | Freq: Two times a day (BID) | OROMUCOSAL | Status: DC
  Administered 2015-06-24 – 2015-06-27 (×6): 15 mL via OROMUCOSAL
  Filled 2015-06-24 (×4): qty 15

## 2015-06-24 MED ORDER — POTASSIUM CHLORIDE 10 MEQ/100ML IV SOLN
10.0000 meq | INTRAVENOUS | Status: AC
  Administered 2015-06-24 (×4): 10 meq via INTRAVENOUS
  Filled 2015-06-24 (×4): qty 100

## 2015-06-24 MED ORDER — CETYLPYRIDINIUM CHLORIDE 0.05 % MT LIQD
7.0000 mL | Freq: Two times a day (BID) | OROMUCOSAL | Status: DC
  Administered 2015-06-26 – 2015-06-27 (×2): 7 mL via OROMUCOSAL

## 2015-06-24 MED ORDER — POTASSIUM PHOSPHATES 15 MMOLE/5ML IV SOLN
30.0000 mmol | Freq: Once | INTRAVENOUS | Status: DC
  Filled 2015-06-24: qty 10

## 2015-06-24 MED ORDER — AMLODIPINE BESYLATE 5 MG PO TABS
5.0000 mg | ORAL_TABLET | Freq: Every day | ORAL | Status: DC
  Administered 2015-06-24 – 2015-06-27 (×4): 5 mg via ORAL
  Filled 2015-06-24 (×4): qty 1

## 2015-06-24 NOTE — Progress Notes (Signed)
[facility] FOR AM LAB REPLACEMENT ONLY  The patient does apply for the [facility] Electrolyte Replacment Protocol based on the criteria listed below:   1. Is GFR >/= 40 ml/min? Yes.    Patient's GFR today is >60 2. Is urine output >/= 0.5 ml/kg/hr for the last 6 hours? Yes.   Patient's UOP is 0.6 ml/kg/hr 3. Is BUN < 60 mg/dL? Yes.    Patient's BUN today is 12 4. Abnormal electrolyte(s): K+3.1 5. Ordered repletion with: protocol 6. If a panic level lab has been reported, has the CCM MD in charge been notified? No..   Physician:  Anselm PancoastSommer  Jerzee Jerome, Renae Fickleaul Hilliard 06/24/2015 5:26 AM

## 2015-06-24 NOTE — Progress Notes (Signed)
PULMONARY / CRITICAL CARE MEDICINE   Name: Charles Bullock MRN: [id number] DOB: Jun 27, 1927    ADMISSION DATE:  06/21/2015  REFERRING MD:  ER  CHIEF COMPLAINT:  Altered mental status  SUBJECTIVE:  NAEON.  EEG this AM, results pending.  Required high doses of propofol overnight due to agitation.  VITAL SIGNS: BP 147/78 mmHg  Pulse 82  Temp(Src) 98.1 F (36.7 C) (Axillary)  Resp 20  Ht  (1.651 m)  Wt 57.2 kg (126 lb 1.7 oz)  BMI 20.98 kg/m2  SpO2 99%  VENTILATOR SETTINGS: Vent Mode:  [-] PRVC FiO2 (%):  [40 %] 40 % Set Rate:  [16 bmp] 16 bmp Vt Set:  [500 mL] 500 mL Plateau Pressure:  [14 cmH20] 14 cmH20  INTAKE / OUTPUT: I/O last 3 completed shifts: In: 2914.6 [I.V.:2119.6; NG/GT:380; IV Piggyback:415] Out: 1150 [Urine:1150]  PHYSICAL EXAMINATION: General:  Alert and interactive Neuro:  Alert and oriented, moving ext to command. HEENT:  Anisocoria Rt eye, Lt eye pupil pinpoint Cardiovascular:  RRR, Nl S1/S2, -M/R/G. Lungs:  Scattered rhonchi. Abdomen:  Soft, non tender, ND and +BS. Musculoskeletal:  No edema and -tenderness. Skin:  No rashes.  LABS:  BMET  Recent Labs Lab 06/22/15 0315 06/23/15 0220 06/24/15 0346  NA 134* 138 142  K 4.1 3.9 3.1*  CL 101 107 110  CO2 BUN CREATININE 0.60* 0.63 0.55*  GLUCOSE 151* 102* 121*   Electrolytes  Recent Labs Lab 06/22/15 0315 06/23/15 0220 06/24/15 0346  CALCIUM 9.1 9.0 8.6*  MG  --   --  2.0  PHOS  --   --  2.5   CBC  Recent Labs Lab 06/22/15 0315 06/23/15 0220 06/24/15 0346  WBC 12.5* 15.2* 14.6*  HGB 14.6 14.4 15.0  HCT 43.3 43.6 44.7  PLT 255 268 223   Coag's  Recent Labs Lab 06/21/15 1400  APTT 30  INR 1.11   ABG  Recent Labs Lab 06/22/15 0425  PHART 7.485*  PCO2ART 29.3*  PO2ART 135*   Liver Enzymes  Recent Labs Lab 06/21/15 1400  AST 39  ALT 24  ALKPHOS 78  BILITOT 1.0  ALBUMIN 3.7   Glucose  Recent Labs Lab 06/23/15 1531  06/23/15 2003 06/23/15 2340 06/24/15 0346 06/24/15 0732 06/24/15 1146  GLUCAP 122* 124* 116* 109* 100* 79   Imaging Dg Chest Port 1 View  06/24/2015  CLINICAL DATA:  Status post intracranial hemorrhage diagnosed 06/21/2015. Respiratory failure. Status post extubation. EXAM: PORTABLE CHEST 1 VIEW COMPARISON:  Single view of the chest 06/23/2015 and 06/22/2015. FINDINGS: The patient is rotated on the exam. Endotracheal tube and NG tube have been removed. Left basilar airspace disease persists. Likely the past cm right upper lobe is unchanged. The right lung appears clear. No pneumothorax or pleural effusion. Heart size is normal. IMPRESSION: Persistent left basilar airspace disease could be due to pneumonia or aspiration. Electronically Signed   By: Drusilla Kanner M.D.   On: 06/24/2015 07:43   STUDIES:  12/17 CT head >> Lt posterior parietal hematoma 12/18 MRI/MRA brain >> 4.5 cm Lt parietal lobe hematoma, mild vasogenic edema, abnormal FLAIR and diffusion signal abnormality within the mesial left temporal lobe/left hippocampus EEG 12/19 >  CULTURES:  ANTIBIOTICS:  SIGNIFICANT EVENTS: 12/17 Admit, neurology consulted  LINES/TUBES: 12/17 ETT >>12/19  DISCUSSION: 81 yo male with reported hx of ETOH presented with acute encephalopathy from ICH, seizure, and HTN emergency.  ASSESSMENT / PLAN:  NEUROLOGIC A:  Lt parietal ICH Hx of ETOH with agitated delirium P:   RASS goal: 0. Attempt to get propofol down as able. D/C propofol. AEDs per neurology Thiamine, folic acid  PULMONARY A: Compromised airway in setting of ICH P:   Titrate O2 for sat of 88-92%. Is per RT protocol. PT evaluation   CARDIOVASCULAR A:  HTN emergency P:  D/C Cardene. Norvasc 5 mg PO daily with holding parameters.  RENAL A:   No acute issues P:   Monitor renal fx, urine outpt, electrolytes.  GASTROINTESTINAL A:   Nutrition P:   D/C TF. Passed swallow evaluation enough for pills not  diet, repeat again in AM. Protonix for SUP  HEMATOLOGIC A:   No acute issues P:  F/u CBC SCD's for DVT prevention  INFECTIOUS A:   No acute issues P:   Monitor clinically  ENDOCRINE A:   No acute issues. P:   Monitor blood sugar on BMET  Spoke with patient through translator, remains very confused but able to follow simple commands.  Hold in the ICU on precedex drip and attempt to taper down.  Remains full code.  The patient is critically ill with multiple organ systems failure and requires high complexity decision making for assessment and support, frequent evaluation and titration of therapies, application of advanced monitoring technologies and extensive interpretation of multiple databases.   Critical Care Time devoted to patient care services described in this note is  35  Minutes. This time reflects time of care of this signee Dr Koren BoundWesam Mckinleigh Schuchart. This critical care time does not reflect procedure time, or teaching time or supervisory time of PA/NP/Med student/Med Resident etc but could involve care discussion time.  Alyson ReedyWesam G. Toan Mort, M.D. [facility]. Pager: [phone]. After hours pager: [phone].

## 2015-06-24 NOTE — Progress Notes (Signed)
Stroke Team Progress Note  HISTORY 81 y.o. male with a past medical history significant for alcohol abuse and otherwise unknown PMH, brought in for evaluation of mental state changes. Patient is currently intubated and thus all clinical information was obtained from the medical record. He was taken to the ED " after his family noticed behavior changes. Last seen normal last night, then this morning was noted to be confused, putting shoes on the wrong feet, garbled speech. Had rapidly declining mental status in ER, requiring intubation, CT head revealed large L ICH. Had one witnessed seizure". Patient was intubated for airway protection. Loaded with 1 gram IV keppra.  SUBJECTIVE sedated on precedex drip  Arousable and able to localize in all 4 extremities. Blood pressure adequately controlled. Wife not at the bedside.EEG y`day left frontal sharps but no definite seizures  OBJECTIVE Most recent Vital Signs: Filed Vitals:   06/24/15 0900 06/24/15 1000 06/24/15 1100 06/24/15 1200  BP: 143/80 147/83 147/78 146/91  Pulse: 85 82 82 89  Temp:    100.2 F (37.9 C)  TempSrc:    Axillary  Resp: Height:      Weight:      SpO2: 97% 99% 99% 100%   CBG (last 3)   Recent Labs  06/24/15 0346 06/24/15 0732 06/24/15 1146  GLUCAP 109* 100* 79    IV Fluid Intake:   . sodium chloride 50 mL/hr at 06/24/15 0700  . dexmedetomidine 1.4 mcg/kg/hr (06/24/15 1245)    MEDICATIONS  . amLODipine  5 mg Oral Daily  . antiseptic oral rinse  7 mL Mouth Rinse QID  . chlorhexidine gluconate  15 mL Mouth Rinse BID  . feeding supplement (PRO-STAT SUGAR FREE 64)  30 mL Per Tube BID  . feeding supplement (VITAL HIGH PROTEIN)  1,000 mL Per Tube Q24H  . folic acid  1 mg Intravenous Daily  . Influenza vac split quadrivalent PF  0.5 mL Intramuscular Tomorrow-1000  . levETIRAcetam  500 mg Intravenous Q12H  . multivitamin with minerals  1 tablet Oral Daily  . pantoprazole (PROTONIX) IV  40 mg  Intravenous QHS  . pneumococcal 23 valent vaccine  0.5 mL Intramuscular Tomorrow-1000  . senna-docusate  1 tablet Oral BID  . thiamine IV  100 mg Intravenous Daily   PRN:  acetaminophen **OR** acetaminophen, fentaNYL (SUBLIMAZE) injection, labetalol, midazolam  Diet:  Diet NPO time specified  Activity:  Bedrest DVT Prophylaxis:  SCDs  CLINICALLY SIGNIFICANT STUDIES Basic Metabolic Panel:   Recent Labs Lab 06/23/15 0220 06/24/15 0346  NA 138 142  K 3.9 3.1*  CL 107 110  CO2 25 25  GLUCOSE 102* 121*  BUN 14 12  CREATININE 0.63 0.55*  CALCIUM 9.0 8.6*  MG  --  2.0  PHOS  --  2.5   Liver Function Tests:   Recent Labs Lab 06/21/15 1400  AST 39  ALT 24  ALKPHOS 78  BILITOT 1.0  PROT 7.6  ALBUMIN 3.7   CBC:  Recent Labs Lab 06/21/15 1400  06/23/15 0220 06/24/15 0346  WBC 16.1*  < > 15.2* 14.6*  NEUTROABS 13.6*  --   --   --   HGB 15.4  < > 14.4 15.0  HCT 46.7  < > 43.6 44.7  MCV 90.3  < > 88.6 88.7  PLT 254  < > 268 223  < > = values in this interval not displayed. Coagulation:   Recent Labs Lab 06/21/15 1400  LABPROT 14.5  INR  1.11   Cardiac Enzymes: No results for input(s): CKTOTAL, CKMB, CKMBINDEX, TROPONINI in the last 168 hours. Urinalysis: No results for input(s): COLORURINE, LABSPEC, PHURINE, GLUCOSEU, HGBUR, BILIRUBINUR, KETONESUR, PROTEINUR, UROBILINOGEN, NITRITE, LEUKOCYTESUR in the last 168 hours.  Invalid input(s): APPERANCEUR Lipid PanelNo results found for: CHOL, TRIG, HDL, CHOLHDL, VLDL, LDLCALC HgbA1C No results found for: HGBA1C  Urine Drug Screen:      Component Value Date/Time   LABOPIA NONE DETECTED 06/22/2015 1706   COCAINSCRNUR NONE DETECTED 06/22/2015 1706   LABBENZ POSITIVE* 06/22/2015 1706   AMPHETMU NONE DETECTED 06/22/2015 1706   THCU NONE DETECTED 06/22/2015 1706   LABBARB NONE DETECTED 06/22/2015 1706    Alcohol Level: No results for input(s): ETH in the last 168 hours.  Ct Head Wo Contrast  06/23/2015  CLINICAL  DATA:  Altered mental status ICH.  Followup scan. EXAM: CT HEAD WITHOUT CONTRAST TECHNIQUE: Contiguous axial images were obtained from the base of the skull through the vertex without intravenous contrast. COMPARISON:  Yesterday FINDINGS: Skull and Sinuses:The patient's orogastric tube coils in the nasopharynx, where there is also layering fluid. No traumatic osseous finding.  Clear sinuses and mastoids. Visualized orbits: No contributory finding. Bilateral cataract resection. Brain: Lobar hematoma centered in the left parietal lobe has unchanged size and appearance, including hematocrit level. As before, maximal dimensions measure up to 4.5 cm. There is no subarachnoid or intraventricular extension. Unchanged local mass effect on the ventricular system and sulci, without midline shift or herniation. Stable surrounding rim of vasogenic edema. No evidence of acute infarct.  No hydrocephalus. IMPRESSION: 1. Size stable 45mm hematoma in the left cerebral hemisphere. 2. Redundant orogastric tube, coiled in the nasopharynx. Electronically Signed   By: Marnee Spring M.D.   On: 06/23/2015 00:21   Dg Chest Port 1 View  06/24/2015  CLINICAL DATA:  Status post intracranial hemorrhage diagnosed 06/21/2015. Respiratory failure. Status post extubation. EXAM: PORTABLE CHEST 1 VIEW COMPARISON:  Single view of the chest 06/23/2015 and 06/22/2015. FINDINGS: The patient is rotated on the exam. Endotracheal tube and NG tube have been removed. Left basilar airspace disease persists. Likely the past cm right upper lobe is unchanged. The right lung appears clear. No pneumothorax or pleural effusion. Heart size is normal. IMPRESSION: Persistent left basilar airspace disease could be due to pneumonia or aspiration. Electronically Signed   By: Drusilla Kanner M.D.   On: 06/24/2015 07:43   Dg Chest Port 1 View  06/23/2015  CLINICAL DATA:  Respiratory failure, history of intracranial hemorrhage EXAM: PORTABLE CHEST 1 VIEW  COMPARISON:  Portable chest x-ray of June 22, 2015 FINDINGS: The lungs are adequately inflated. There is infiltrate in the left lower lobe partially obscuring the hemidiaphragm. There is patchy density in the right suprahilar region. There is no pleural effusion or pneumothorax. The heart and pulmonary vascularity are normal. The endotracheal tube tip lies approximately 2 cm above the carina. The esophagogastric tube tip projects below the inferior margin of the image. The proximal port is just below the level of the GE junction. IMPRESSION: 1. Left lower lobe and right suprahilar interstitial infiltrate compatible with pneumonia. 2. Withdrawal of the endotracheal tube by 2 cm is recommended. Advancement of the nasogastric tube by 5 cm is recommended. Electronically Signed   By: David  [place] M.D.   On: 06/23/2015 08:17    CT of the brain  Left posterior parietal parenchymal hematoma is identified. Findings compatible with hemorrhagic infarct. Underlying lesion not excluded.  MRI of the brain  3.8 x 4.4 x 4.5 cm lobe large parenchymal hematoma within the left parietal lobe. There is mild localized vasogenic edema with partial effacement of the left lateral ventricle and trace left-to-right shift. No underlying mass lesion or vascular malformation identified. In the absence of trauma, primary differential consideration includes cerebral amyloid angiopathy  MRA of the brain  Negative intracranial MRA. No vascular malformation or aneurysm to account for left parietal lobar hematoma  Carotid Doppler  Pending   2D Echocardiogram  Pending   CXR  06/23/15 :Left lower lobe and right suprahilar interstitial infiltrate compatible with pneum EEG 06/23/15 : 1) Focal left frontotemporal sharp waves 2) focal left frontotemporal delta activity 3) attenuation of frequencies in the left posterior quadrant  Therapy Recommendations pending   Neurological Examination Mental Status: Sedated on  propofol. Can be aroused with partially open eyes. Spontaneous eye and head movements. not following commands.  Cranial Nerves: II: no response to visual threatens. bl post surgical pupils. Corneal reflexes are present. Fundi could not be visualized. III,IV, VI: ptosis not present, extra-ocular motions intact bilaterally  Motor: Withdraws L side > R side. Not following commands.  Tone and bulk:normal tone throughout; no atrophy noted Sensory: Unable to test Deep Tendon Reflexes: 1+ and symmetric throughout Plantars: Right: downgoing   Left: downgoing Cerebellar: Not tested  Gait: not tested.       ASSESSMENT 81 y.o. male with a past medical history significant for alcohol abuse and otherwise unknown PMH, brought in for evaluation of mental state changes. Patient is currently intubated and thus all clinical information was obtained from the medical record. He was taken to the ED " after his family noticed behavior changes.   Had rapidly declining mental status in ER, requiring intubation, CT head revealed large L ICH. Had one witnessed seizure". Patient was intubated for airway protection. Loaded with 1 gram IV keppra.  ICH: - Cortical, not likely HTN related possibly alcohol or amyloid related   -- SBP <180 - SCDs DVT prophylaxis     Seizures: - EEG left frontal sharps but no seizures  - on Keppra- no breakthru seizures  Sedation: - on Precedex  -  Plan titration down as tolerated  ETOH use - thiamine  - folate  Family: - s/p family discussion at bedside - con't aggressive care and full code. Discussed with Dr. Molli KnockYacoub   This patient is critically ill and at significant risk of neurological worsening, death and care requires constant monitoring of vital signs, hemodynamics,respiratory and cardiac monitoring, extensive review of multiple databases, frequent neurological assessment, discussion with family, other specialists and medical decision making of high complexity.  High risk of further hemorrhage, seizures, hemodynamic instability. 30 minutes of critical care spent.   Hospital day # 3 SETHI,PRAMOD  SIGNED    To contact Stroke Continuity provider, please refer to [email or web address]. After hours, contact General Neurology

## 2015-06-24 NOTE — Evaluation (Signed)
Clinical/Bedside Swallow Evaluation Patient Details  Name: Charles Bullock MRN: [id number] Date of Birth: 1926/10/10  Today's Date: 06/24/2015 Time: SLP Start Time (ACUTE ONLY): 1200 SLP Stop Time (ACUTE ONLY): 1217 SLP Time Calculation (min) (ACUTE ONLY): 17 min  Past Medical History:  Past Medical History  Diagnosis Date  . ETOH abuse    Past Surgical History: History reviewed. No pertinent past surgical history. HPI:  81 yo male with reported hx of ETOH presented with acute encephalopathy from ICH, seizure, and HTN emergency.  Intubated 12/17-19.  CT large left posterior parietal parenchymal hematoma.   Assessment / Plan / Recommendation Clinical Impression  Pt presents with dysphagia s/p ICH and intubation. Interpretor present for exam.  Pt unable to follow simple commands nor imitate motor movements despite max tactile/verbal/visual cues.  There was right side inattention.  He talked throughout assessment, perseverating on the word "hombres."  He did not answer yes/no questions nor request/decline POs.  Notable was poor anticipation of cup/spoon approaching mouth; significant oral delays; wet phonation/mild throat clearing accompanying consumption of thin liquids, concerning for aspiration. Recommend continuing NPO for now except meds given whole in puree; may have applesauce, yogurt if attentive to hand-feeding.  SLP will f/u next date for improved MS, attention, readiness to advance diet and /or proceed with instrumental testing.       Aspiration Risk  Mild aspiration risk    Diet Recommendation          Other  Recommendations Oral Care Recommendations: Oral care QID   Follow up Recommendations   (tba)    Frequency and Duration min 3x week  2 weeks       Prognosis Prognosis for Safe Diet Advancement: Good      Swallow Study   General Date of Onset: 06/21/15 HPI: 81 yo male with reported hx of ETOH presented with acute encephalopathy from ICH, seizure, and HTN  emergency.  Intubated 12/17-19.  CT large left posterior parietal parenchymal hematoma. Type of Study: Bedside Swallow Evaluation Previous Swallow Assessment: none Diet Prior to this Study: NPO Temperature Spikes Noted: No Respiratory Status: Room air History of Recent Intubation: Yes Length of Intubations (days): 2 days Date extubated: 06/23/15 Behavior/Cognition: Alert;Cooperative;Confused Oral Cavity Assessment: Dry Oral Care Completed by SLP: Yes Oral Cavity - Dentition: Missing dentition Vision: Functional for self-feeding Self-Feeding Abilities: Total assist Patient Positioning: Upright in bed Baseline Vocal Quality: Normal Volitional Cough: Cognitively unable to elicit Volitional Swallow: Unable to elicit    Oral/Motor/Sensory Function Overall Oral Motor/Sensory Function:  (symmetry at rest)   Ice Chips Ice chips: Impaired Presentation: Spoon Oral Phase Impairments: Poor awareness of bolus Oral Phase Functional Implications: Prolonged oral transit Pharyngeal Phase Impairments: Suspected delayed Swallow   Thin Liquid Thin Liquid: Impaired Presentation: Cup Oral Phase Impairments: Reduced labial seal;Poor awareness of bolus Oral Phase Functional Implications: Prolonged oral transit Pharyngeal  Phase Impairments: Wet Vocal Quality;Throat Clearing - Immediate    Nectar Thick Nectar Thick Liquid: Not tested   Honey Thick Honey Thick Liquid: Not tested   Puree Puree: Impaired Presentation: Spoon Oral Phase Impairments: Poor awareness of bolus Oral Phase Functional Implications: Prolonged oral transit Pharyngeal Phase Impairments: Suspected delayed Swallow   Solid Solid: Not tested      Marchelle FolksAmanda L. Samson Fredericouture, [place]MA CCC/SLP Pager [phone]  Blenda MountsCouture, Lashawna Poche Laurice 06/24/2015,12:33 PM

## 2015-06-24 NOTE — Progress Notes (Signed)
Spoke with Dr. Hosie PoissonSumner in regards to patient's blood pressure running 160s-170s systolic and 90s diastolic. Notified him that patient has received 60 mg of labetalol within the past two hours, and heart rate is now in upper 50s-low 60s. Stated to start cardene at this time.

## 2015-06-25 DIAGNOSIS — G9340 Encephalopathy, unspecified: Secondary | ICD-10-CM

## 2015-06-25 DIAGNOSIS — F10239 Alcohol dependence with withdrawal, unspecified: Secondary | ICD-10-CM

## 2015-06-25 LAB — TRIGLYCERIDES: Triglycerides: 55 mg/dL (ref <150)

## 2015-06-25 LAB — BASIC METABOLIC PANEL
ANION GAP: 10 (ref 5–15)
BUN: 14 mg/dL (ref 6–20)
CHLORIDE: 113 mmol/L — AB (ref 101–111)
CO2: 19 mmol/L — ABNORMAL LOW (ref 22–32)
Calcium: 8.6 mg/dL — ABNORMAL LOW (ref 8.9–10.3)
Creatinine, Ser: 0.64 mg/dL (ref 0.61–1.24)
Glucose, Bld: 92 mg/dL (ref 65–99)
POTASSIUM: 3.5 mmol/L (ref 3.5–5.1)
SODIUM: 142 mmol/L (ref 135–145)

## 2015-06-25 LAB — CBC
HCT: 40.2 % (ref 39.0–52.0)
HEMOGLOBIN: 13.3 g/dL (ref 13.0–17.0)
MCH: 29.5 pg (ref 26.0–34.0)
MCHC: 33.1 g/dL (ref 30.0–36.0)
MCV: 89.1 fL (ref 78.0–100.0)
PLATELETS: 214 10*3/uL (ref 150–400)
RBC: 4.51 MIL/uL (ref 4.22–5.81)
RDW: 13.6 % (ref 11.5–15.5)
WBC: 13.4 10*3/uL — AB (ref 4.0–10.5)

## 2015-06-25 LAB — GLUCOSE, CAPILLARY
GLUCOSE-CAPILLARY: 77 mg/dL (ref 65–99)
GLUCOSE-CAPILLARY: 69 mg/dL (ref 65–99)
Glucose-Capillary: 175 mg/dL — ABNORMAL HIGH (ref 65–99)

## 2015-06-25 LAB — PHOSPHORUS: PHOSPHORUS: 2.3 mg/dL — AB (ref 2.5–4.6)

## 2015-06-25 LAB — MAGNESIUM: MAGNESIUM: 2 mg/dL (ref 1.7–2.4)

## 2015-06-25 MED ORDER — POTASSIUM PHOSPHATES 15 MMOLE/5ML IV SOLN
30.0000 mmol | Freq: Once | INTRAVENOUS | Status: AC
  Administered 2015-06-25: 30 mmol via INTRAVENOUS
  Filled 2015-06-25: qty 10

## 2015-06-25 MED ORDER — ENSURE ENLIVE PO LIQD
237.0000 mL | Freq: Two times a day (BID) | ORAL | Status: DC
  Administered 2015-06-26 – 2015-06-27 (×3): 237 mL via ORAL
  Filled 2015-06-25 (×6): qty 237

## 2015-06-25 NOTE — Progress Notes (Signed)
   06/25/15 1200  SLP Visit Information  SLP Received On 06/25/15  General Information  Behavior/Cognition Alert;Cooperative;Confused;Requires cueing  Patient Positioning Upright in chair/Tumbleform  HPI 81 yo male with reported hx of ETOH presented with acute encephalopathy from ICH, seizure, and HTN emergency.  Intubated 12/17-19.  CT large left posterior parietal parenchymal hematoma.  Temperature Spikes Noted No  Respiratory Status Room air  Oral Cavity - Dentition Missing dentition  Patient observed directly with PO's Yes. Pt more automatic with PO intake today, SLP able to branch from total assist to grasp cup and bring to lips to min visual/gestural cues for self feeding with cup and spoon. Pt able to tolerate regular solids and puree well but does demonstrate coughing concerning for aspiration with thin and nectar thick liquids, though pt did drink these very quickly. Will initiate honey thick liquids and mechanical soft solids and monitor for tolerance and possible upgrade. May need MBS eventually.   Type of PO's observed Dysphagia 3 (soft);Dysphagia 1 (puree);Thin liquids;Nectar-thick liquids;Honey-thick liquids  Feeding Able to feed self  Liquids provided via Straw;Cup  Treatment Provided  Treatment provided Dysphagia  Dysphagia Treatment  Treatment Methods Skilled observation;Upgraded PO texture trial;Differential diagnosis  Pharyngeal Phase Signs & Symptoms Immediate cough;Wet vocal quality;Delayed cough  Type of cueing Verbal;Tactile;Visual  Amount of cueing Maximal  Pain Assessment  Pain Assessment Faces  Faces Pain Scale 0  SLP - End of Session  Patient left in chair;with nursing in room  Nurse Communication Diet recommendation  Assessment / Recommendations / Plan  Plan Continue with current plan of care  Dysphagia Recommendations  Diet recommendations Dysphagia 3 (mechanical soft);Honey-thick liquid  Liquids provided via Cup  Medication Administration Whole meds with  puree  Supervision Staff to assist with self feeding  Compensations Minimize environmental distractions;Slow rate;Small sips/bites  Postural Changes and/or Swallow Maneuvers Seated upright 90 degrees  Progression Toward Goals  Progression toward goals Progressing toward goals  SLP Time Calculation  SLP Start Time (ACUTE ONLY) 1020  SLP Stop Time (ACUTE ONLY) 1100  SLP Time Calculation (min) (ACUTE ONLY) 40 min  SLP Evaluations  $ SLP Speech Visit 1 Procedure  SLP Evaluations  $Swallowing Treatment 1 Procedure

## 2015-06-25 NOTE — Evaluation (Signed)
Speech Language Pathology Evaluation Patient Details Name: Charles Bullock MRN: [id number] DOB: 1926-09-22 Today's Date: 06/25/2015 Time: 1020-1100 SLP Time Calculation (min) (ACUTE ONLY): 40 min  Problem List:  Patient Active Problem List   Diagnosis Date Noted  . Intraparenchymal hematoma of brain (HCC) 06/21/2015  . ICH (intracerebral hemorrhage) (HCC) 06/21/2015   Past Medical History:  Past Medical History  Diagnosis Date  . ETOH abuse    Past Surgical History: History reviewed. No pertinent past surgical history. HPI:  81 yo male with reported hx of ETOH presented with acute encephalopathy from ICH, seizure, and HTN emergency.  Intubated 12/17-19.  CT large left posterior parietal parenchymal hematoma.   Assessment / Plan / Recommendation Clinical Impression  Pt demsontrates global aphasia with severely impaired receptive and expressive deficits. Pt is able to verbalize some automatic phrases in Spanish, though they are not related to conversational context "thats delicious", "that's right", "thanks" "[name]" . Pt does not follow any verbal commands, but can participate in basic functional tasks with max contextual and tactile cues (self feeding, transfers, opening packages, washing face). Pt needed cues to attend to speakers in right visual field. SLP attempted to facilitate counting with melodic intonation with very minimal success. Will continue effort to facilitate functional communication. Pt will need f/u, suggest CIR.     SLP Assessment  Patient needs continued Speech Lanaguage Pathology Services    Follow Up Recommendations  Inpatient Rehab    Frequency and Duration min 3x week  2 weeks      SLP Evaluation Prior Functioning  Cognitive/Linguistic Baseline: Information not available   Cognition  Overall Cognitive Status: Impaired/Different from baseline Arousal/Alertness: Awake/alert Orientation Level: Other (comment);Oriented to person (unable to assess due  to aphasia) Attention: Focused;Sustained Focused Attention: Appears intact Sustained Attention: Impaired Sustained Attention Impairment: Verbal complex;Functional complex Awareness: Impaired Awareness Impairment: Intellectual impairment;Emergent impairment;Anticipatory impairment Problem Solving: Impaired Problem Solving Impairment: Verbal basic;Functional basic Behaviors: Impulsive Safety/Judgment: Impaired    Comprehension  Auditory Comprehension Overall Auditory Comprehension: Impaired Yes/No Questions: Impaired Basic Biographical Questions: 0-25% accurate Commands: Impaired One Step Basic Commands: 0-24% accurate EffectiveTechniques: Visual/Gestural cues Visual Recognition/Discrimination Discrimination: Not tested    Expression Verbal Expression Overall Verbal Expression: Impaired Initiation: No impairment Automatic Speech: Name;Social Response Level of Generative/Spontaneous Verbalization: Word;Phrase Repetition: Impaired Level of Impairment: Word level Naming: Impairment Responsive: Not tested Confrontation: Impaired Convergent: Not tested Divergent: Not tested Verbal Errors: Perseveration;Not aware of errors Pragmatics: No impairment Effective Techniques: Melodic intonation Non-Verbal Means of Communication: Not applicable Written Expression Dominant Hand: Right Written Expression: Not tested   Oral / Motor Motor Speech Overall Motor Speech: Appears within functional limits for tasks assessed    [employer] Null, Riley NearingBonnie Caroline 06/25/2015, 12:16 PM

## 2015-06-25 NOTE — Evaluation (Signed)
Occupational Therapy Evaluation Patient Details Name: Charles Bullock MRN: [id number] DOB: 1927-05-20 Today's Date: 06/25/2015    History of Present Illness 81 yo male admitted with ICH seizure and HTN emergency. intubated 12/17-12/19. PMH: ETOH abuse, HTN,    Clinical Impression   PT admitted with ICH seizure and HTN. Pt currently with functional limitiations due to the deficits listed below (see OT problem list). PTA unknown at this time and patient unable to verbalize in spanish or english.  Pt will benefit from skilled OT to increase their independence and safety with adls and balance to allow discharge CIR. Session communication provided by SLP Kendal Hymen in spanish. Pt with same response in spanish and english during assessment. Pt responding to therapist in spanish during session.      Follow Up Recommendations  CIR;Supervision/Assistance - 24 hour    Equipment Recommendations  Other (comment) (defer to next venue)    Recommendations for Other Services Rehab consult     Precautions / Restrictions Precautions Precautions: Fall      Mobility Bed Mobility Overal bed mobility: Needs Assistance Bed Mobility: Supine to Sit     Supine to sit: Max assist;HOB elevated     General bed mobility comments: max cueing and gestures to get patient to participate. pt once initiated completing task with less (A)   Transfers Overall transfer level: Needs assistance   Transfers: Sit to/from Stand Sit to Stand: +2 physical assistance;Mod assist         General transfer comment: pt once static standing one person (A) but voiding bladder without warning or awareness    Balance Overall balance assessment: Needs assistance Sitting-balance support: Bilateral upper extremity supported;Feet supported Sitting balance-Leahy Scale: Poor       Standing balance-Leahy Scale: Poor                              ADL Overall ADL's : Needs assistance/impaired   Eating/Feeding  Details (indicate cue type and reason): SLP present and assessing diet needs. pt needed max hand over hand to initiate drinking from cup. Pt attempting to bite all 3 graham crackers in a pack instead of one cracker at a time. pt attempting to eat package instead of opening food. pt will require hand over hand (A) for all meals with redirection to task due to poor attention Grooming: Wash/dry face;Maximal assistance;Sitting Grooming Details (indicate cue type and reason): hand over hand Upper Body Bathing: Total assistance   Lower Body Bathing: Total assistance           Toilet Transfer: +2 for physical assistance;Moderate assistance;Stand-pivot Toilet Transfer Details (indicate cue type and reason): pt static standing at EOB and voiding bladder with lack of awareness. pt no response to voiding bladder. pt standing with no change in behavior or facial expressiong           General ADL Comments: Pt with global aphasia and responding with inappropriate responses to questions. Pt states in spanaish "thats correct" when therapist bonnie SLP states my name is bonnie her name is Aundra Millet this is brynn . What is your name?"      Vision Additional Comments: difficult to assess but patient able to complete hand to mouth feeding    Perception     Praxis      Pertinent Vitals/Pain Pain Assessment: No/denies pain Faces Pain Scale: No hurt     Hand Dominance Right   Extremity/Trunk Assessment Upper Extremity Assessment Upper Extremity Assessment:  Generalized weakness   Lower Extremity Assessment Lower Extremity Assessment: Defer to PT evaluation   Cervical / Trunk Assessment Cervical / Trunk Assessment: Kyphotic   Communication     Cognition Arousal/Alertness: Awake/alert   Overall Cognitive Status: Impaired/Different from baseline Area of Impairment: Following commands;Safety/judgement;Awareness       Following Commands: Follows one step commands inconsistently;Follows one step  commands with increased time Safety/Judgement: Decreased awareness of deficits;Decreased awareness of safety Awareness: Intellectual   General Comments: pt demonstrates global aphasia making assessment of cognitive deficits more challenging . Pt repeating same task over and over again during session ( so perservation )   General Comments       Exercises       Shoulder Instructions      Home Living Family/patient expects to be discharged to:: Unsure                                        Prior Functioning/Environment          Comments: unknown but was from home     OT Diagnosis: Generalized weakness;Cognitive deficits   OT Problem List: Decreased strength;Decreased activity tolerance;Impaired balance (sitting and/or standing);Decreased cognition;Decreased safety awareness;Decreased knowledge of use of DME or AE;Decreased knowledge of precautions   OT Treatment/Interventions: Self-care/ADL training;Therapeutic exercise;DME and/or AE instruction;Therapeutic activities;Cognitive remediation/compensation;Patient/family education;Balance training    OT Goals(Current goals can be found in the care plan section) Acute Rehab OT Goals Patient Stated Goal: patient global aphasia unable to participate OT Goal Formulation: Patient unable to participate in goal setting Time For Goal Achievement: 07/09/15 Potential to Achieve Goals: Good  OT Frequency: Min 2X/week   Barriers to D/C: Other (comment) (unknown at this time)          Co-evaluation              End of Session Equipment Utilized During Treatment: Gait belt Nurse Communication: Mobility status;Precautions  Activity Tolerance: Patient tolerated treatment well (HR 132) Patient left: in chair;with call bell/phone within reach;with nursing/sitter in room   Time: [id number] OT Time Calculation (min): 29 min Charges:  OT General Charges $OT Visit: 1 Procedure OT Evaluation $Initial OT Evaluation  Tier I: 1 Procedure G-Codes:    Harolyn RutherfordJones, Candelaria Pies B 06/25/2015, 3:32 PM   Mateo FlowJones, Brynn   OTR/L Pager: [phone]: [phone] Office: [phone] .

## 2015-06-25 NOTE — Evaluation (Signed)
Physical Therapy Evaluation Patient Details Name: Rodman CompGualberto Sutter MRN: [id number] DOB: [birth year] Today's Date: 06/25/2015   History of Present Illness  81 yo male admitted with ICH seizure and HTN emergency. intubated 12/17-12/19. PMH: ETOH abuse, HTN,   Clinical Impression  Pt with global aphasia and not responding in either [place] or when SLP speaks Spanish.  Pt voiding immediately upon standing and with no recognition that he was voiding on the floor.  Per conversation with RN pt lives in [place] and has family in that area who would like him to D/C back towards that area.  Feel pt would benefit from CIR level of therapies in the [place] area.  Will continue to follow.      Follow Up Recommendations CIR    Equipment Recommendations  None recommended by PT    Recommendations for Other Services Rehab consult     Precautions / Restrictions Precautions Precautions: Fall Restrictions Weight Bearing Restrictions: No      Mobility  Bed Mobility Overal bed mobility: Needs Assistance Bed Mobility: Supine to Sit     Supine to sit: Max assist;HOB elevated     General bed mobility comments: max cueing and gestures to get patient to participate. pt once initiated completing task with less (A)   Transfers Overall transfer level: Needs assistance Equipment used: 2 person hand held assist Transfers: Sit to/from Stand Sit to Stand: +2 physical assistance;Mod assist         General transfer comment: pt once static standing one person (A) but voiding bladder without warning or awareness  Ambulation/Gait Ambulation/Gait assistance: Min assist;+2 physical assistance Ambulation Distance (Feet): 10 Feet Assistive device: 2 person hand held assist Gait Pattern/deviations: Step-through pattern;Decreased stride length     General Gait Details: pt mildly unsteady and with need for gestural cues.  pt's HR increased to 140's during ambulation only short distance.    Stairs             Wheelchair Mobility    Modified Rankin (Stroke Patients Only) Modified Rankin (Stroke Patients Only) Pre-Morbid Rankin Score: No symptoms (Unsure, but assumed based on chart review.) Modified Rankin: Moderately severe disability     Balance Overall balance assessment: Needs assistance Sitting-balance support: Bilateral upper extremity supported;Feet supported Sitting balance-Leahy Scale: Poor     Standing balance support: During functional activity Standing balance-Leahy Scale: Poor                               Pertinent Vitals/Pain Pain Assessment: No/denies pain Faces Pain Scale: No hurt    Home Living Family/patient expects to be discharged to:: Unsure                      Prior Function           Comments: unknown but was from home      Hand Dominance   Dominant Hand: Right    Extremity/Trunk Assessment   Upper Extremity Assessment: Defer to OT evaluation           Lower Extremity Assessment: Generalized weakness      Cervical / Trunk Assessment: Kyphotic  Communication   Communication: Receptive difficulties;Expressive difficulties (pt not responding in [place] or [place].  )  Cognition Arousal/Alertness: Awake/alert   Overall Cognitive Status: Impaired/Different from baseline Area of Impairment: Following commands;Safety/judgement;Awareness       Following Commands: Follows one step commands inconsistently;Follows one step commands with increased time Safety/Judgement:  Decreased awareness of deficits;Decreased awareness of safety Awareness: Intellectual   General Comments: pt demonstrates global aphasia making assessment of cognitive deficits more challenging . Pt repeating same task over and over again during session ( so perservation )    General Comments      Exercises        Assessment/Plan    PT Assessment Patient needs continued PT services  PT Diagnosis Difficulty walking   PT Problem  List Decreased strength;Decreased activity tolerance;Decreased balance;Decreased mobility;Decreased cognition;Decreased knowledge of use of DME;Decreased safety awareness  PT Treatment Interventions DME instruction;Gait training;Stair training;Functional mobility training;Therapeutic activities;Therapeutic exercise;Balance training;Neuromuscular re-education;Cognitive remediation;Patient/family education   PT Goals (Current goals can be found in the Care Plan section) Acute Rehab PT Goals Patient Stated Goal: patient global aphasia unable to participate PT Goal Formulation: Patient unable to participate in goal setting Time For Goal Achievement: 07/09/15 Potential to Achieve Goals: Good    Frequency Min 4X/week   Barriers to discharge        Co-evaluation PT/OT/SLP Co-Evaluation/Treatment: Yes Reason for Co-Treatment: Necessary to address cognition/behavior during functional activity;Other (comment) (SLP A with speaking Spanish and due to Global Aphasia) PT goals addressed during session: Mobility/safety with mobility;Balance         End of Session Equipment Utilized During Treatment: Gait belt Activity Tolerance: Patient tolerated treatment well Patient left: in chair;with call bell/phone within reach;with chair alarm set Nurse Communication: Mobility status         Time: 1610-9604 PT Time Calculation (min) (ACUTE ONLY): 28 min   Charges:   PT Evaluation $Initial PT Evaluation Tier I: 1 Procedure     PT G CodesSunny Schlein, [place] [phone] 06/25/2015, 3:57 PM

## 2015-06-25 NOTE — Progress Notes (Signed)
PULMONARY / CRITICAL CARE MEDICINE   Name: Charles Bullock MRN: [id number] DOB: May 05, 1927    ADMISSION DATE:  06/21/2015  REFERRING MD:  ER  CHIEF COMPLAINT:  Altered mental status  SUBJECTIVE:  Aphasic but following basic commands, able to swallow.  VITAL SIGNS: BP 165/91 mmHg  Pulse 101  Temp(Src) 98.3 F (36.8 C) (Oral)  Resp 18  Ht  (1.651 m)  Wt 55.9 kg (123 lb 3.8 oz)  BMI 20.51 kg/m2  SpO2 98%  VENTILATOR SETTINGS:    INTAKE / OUTPUT: I/O last 3 completed shifts: In: 3131.3 [I.V.:2416.3; IV Piggyback:715] Out: 1500 [Urine:1500]  PHYSICAL EXAMINATION: General:  Alert and interactive, moving all ext. Neuro:  Alert and oriented, moving ext to command. HEENT:  Anisocoria Rt eye, Lt eye pupil pinpoint Cardiovascular:  RRR, Nl S1/S2, -M/R/G. Lungs:  Scattered rhonchi. Abdomen:  Soft, non tender, ND and +BS. Musculoskeletal:  No edema and -tenderness. Skin:  No rashes.  LABS:  BMET  Recent Labs Lab 06/23/15 0220 06/24/15 0346 06/25/15 0315  NA 138 142 142  K 3.9 3.1* 3.5  CL 107 110 113*  CO2 25 25 19*  BUN CREATININE 0.63 0.55* 0.64  GLUCOSE 102* 121* 92   Electrolytes  Recent Labs Lab 06/23/15 0220 06/24/15 0346 06/25/15 0315  CALCIUM 9.0 8.6* 8.6*  MG  --  2.0 2.0  PHOS  --  2.5 2.3*   CBC  Recent Labs Lab 06/23/15 0220 06/24/15 0346 06/25/15 0315  WBC 15.2* 14.6* 13.4*  HGB 14.4 15.0 13.3  HCT 43.6 44.7 40.2  PLT 268 223 214   Coag's  Recent Labs Lab 06/21/15 1400  APTT 30  INR 1.11   ABG  Recent Labs Lab 06/22/15 0425  PHART 7.485*  PCO2ART 29.3*  PO2ART 135*   Liver Enzymes  Recent Labs Lab 06/21/15 1400  AST 39  ALT 24  ALKPHOS 78  BILITOT 1.0  ALBUMIN 3.7   Glucose  Recent Labs Lab 06/24/15 1146 06/24/15 1552 06/24/15 1954 06/24/15 2335 06/25/15 0400 06/25/15 0812  GLUCAP 79 90 95 92 77 69   Imaging  I reviewed CT myself, lt posterior parietal hematoma noted, CXR  clear.  STUDIES:  12/17 CT head >> Lt posterior parietal hematoma 12/18 MRI/MRA brain >> 4.5 cm Lt parietal lobe hematoma, mild vasogenic edema, abnormal FLAIR and diffusion signal abnormality within the mesial left temporal lobe/left hippocampus EEG 12/19 >  CULTURES:  ANTIBIOTICS:  SIGNIFICANT EVENTS: 12/17 Admit, neurology consulted  LINES/TUBES: 12/17 ETT >>12/19  DISCUSSION: 81 yo male with reported hx of ETOH presented with acute encephalopathy from ICH, seizure, and HTN emergency.  ASSESSMENT / PLAN:  NEUROLOGIC A:  Lt parietal ICH Hx of ETOH with agitated delirium P:   D/C precedex. AEDs per neurology. Thiamine, folic acid.  PULMONARY A: Compromised airway in setting of ICH P:   Titrate O2 for sat of 88-92%. IS per RT protocol. PT as ordered.  CARDIOVASCULAR A:  HTN emergency P:  D/C Cardene. Norvasc 5 mg PO daily with holding parameters.  RENAL A:   No acute issues P:   Monitor renal fx, urine outpt, electrolytes.  GASTROINTESTINAL A:   Nutrition P:   D/C TF. Diet per speech pathology. Protonix for SUP  HEMATOLOGIC A:   No acute issues P:  F/u CBC SCD's for DVT prevention  INFECTIOUS A:   No acute issues P:   Monitor clinically  ENDOCRINE A:   No acute issues. P:  Monitor blood sugar on BMET  Discussed with TRH-MD and bedside RN.  [facility]22.  The patient is critically ill with multiple organ systems failure and requires high complexity decision making for assessment and support, frequent evaluation and titration of therapies, application of advanced monitoring technologies and extensive interpretation of multiple databases.   Critical Care Time devoted to patient care services described in this note is  35  Minutes. This time reflects time of care of this signee Dr Koren BoundWesam Alissandra Geoffroy. This critical care time does not reflect procedure time, or teaching time or supervisory time of  PA/NP/Med student/Med Resident etc but could involve care discussion time.  Alyson ReedyWesam G. Rosha Cocker, M.D. [facility]. Pager: [phone]. After hours pager: [phone].

## 2015-06-25 NOTE — Progress Notes (Signed)
Interpreter Wyvonnia DuskyGraciela Namihira for [facility] evaluation

## 2015-06-25 NOTE — Progress Notes (Signed)
Nutrition Follow-up  DOCUMENTATION CODES:   Non-severe (moderate) malnutrition in context of chronic illness   Pt meets criteria for MODERATE MALNUTRITION in the context of chronic illness as evidenced by mild/moderate fat and muscle depletion.   INTERVENTION:   Ensure Enlive po BID, each supplement provides 350 kcal and 20 grams of protein  NUTRITION DIAGNOSIS:   Inadequate oral intake related to dysphagia as evidenced by meal completion < 50%. Ongoing.   GOAL:   Patient will meet greater than or equal to 90% of their needs Not yet met.   MONITOR:   PO intake, Supplement acceptance, Diet advancement, Weight trends, Labs, I & O's  ASSESSMENT:   81 yo spanish speaking male with hx heavy ETOH, otherwise unknown PMH presented 12/17 after his family noticed behavior changes. Was noted to be confused, putting shoes on the wrong feet, garbled speech. Had rapidly declining mental status in ER, requiring intubation, CT head revealed large L ICH. Had one witness seizure, none further noted.   Pt extubated 12/19. Passed swallow eval today.  Spoke with granddaughter at bedside who reports that pt drinks 3 40 oz beers starting around noon each day. He typically eats 2 meals per day but is not able to be specific.  Medications reviewed and include: folic acid, potassium phosphate, senokot s, thiamine, MVI Labs reviewed: phosphorus low  (2.3) Nutrition-Focused physical exam completed. Findings are mild/moderate fat depletion, mild/moderate muscle depletion, and no edema.     Diet Order:  DIET DYS 3 Room service appropriate?: Yes; Fluid consistency:: Honey Thick  Skin:  Reviewed, no issues  Last BM:  unknown  Height:   Ht Readings from Last 1 Encounters:  06/21/15 _0  (1.651 m)   Weight:   Wt Readings from Last 1 Encounters:  06/25/15 123 lb 3.8 oz (55.9 kg)  Usual weight unknown.   Ideal Body Weight:  61.8 kg  BMI:  Body mass index is 20.51 kg/(m^2).  Estimated  Nutritional Needs:   Kcal:  1400-1600  Protein:  80-90 grams  Fluid:  1.7 L/day  EDUCATION NEEDS:   No education needs identified at this time  [place], [place], [place] Pager [phone] After Hours Pager

## 2015-06-25 NOTE — Progress Notes (Signed)
Stroke Team Progress Note  HISTORY 81 y.o. male with a past medical history significant for alcohol abuse and otherwise unknown PMH, brought in for evaluation of mental state changes. Patient is currently intubated and thus all clinical information was obtained from the medical record. He was taken to the ED " after his family noticed behavior changes. Last seen normal last night, then this morning was noted to be confused, putting shoes on the wrong feet, garbled speech. Had rapidly declining mental status in ER, requiring intubation, CT head revealed large L ICH. Had one witnessed seizure". Patient was intubated for airway protection. Loaded with 1 gram IV keppra.  SUBJECTIVE Now off precedex drip  Arousable and able to localize in all 4 extremities. Blood pressure adequately controlled. Wife not at the bedside.  OBJECTIVE Most recent Vital Signs: Filed Vitals:   06/25/15 1000 06/25/15 1045 06/25/15 1200 06/25/15 1300  BP: 143/76 143/76 148/79 164/93  Pulse: 80  100 99  Temp:   97.2 F (36.2 C)   TempSrc:   Oral   Resp: 19  17 21   Height:      Weight:      SpO2: 98%  96% 95%   CBG (last 3)   Recent Labs  06/25/15 0400 06/25/15 0812 06/25/15 1146  GLUCAP 77 69 175*    IV Fluid Intake:   . sodium chloride 50 mL/hr at 06/25/15 1300  . dexmedetomidine Stopped (06/25/15 0600)    MEDICATIONS  . amLODipine  5 mg Oral Daily  . antiseptic oral rinse  7 mL Mouth Rinse q12n4p  . chlorhexidine  15 mL Mouth Rinse BID  . feeding supplement (PRO-STAT SUGAR FREE 64)  30 mL Per Tube BID  . feeding supplement (VITAL HIGH PROTEIN)  1,000 mL Per Tube Q24H  . folic acid  1 mg Intravenous Daily  . Influenza vac split quadrivalent PF  0.5 mL Intramuscular Tomorrow-1000  . levETIRAcetam  500 mg Intravenous Q12H  . multivitamin with minerals  1 tablet Oral Daily  . pantoprazole (PROTONIX) IV  40 mg Intravenous QHS  . pneumococcal 23 valent vaccine  0.5 mL Intramuscular Tomorrow-1000  .  potassium phosphate IVPB (mmol)  30 mmol Intravenous Once  . senna-docusate  1 tablet Oral BID  . thiamine IV  100 mg Intravenous Daily   PRN:  acetaminophen **OR** acetaminophen, fentaNYL (SUBLIMAZE) injection, labetalol, midazolam  Diet:  DIET DYS 3 Room service appropriate?: Yes; Fluid consistency:: Honey Thick  Activity:  Bedrest DVT Prophylaxis:  SCDs  CLINICALLY SIGNIFICANT STUDIES Basic Metabolic Panel:   Recent Labs Lab 06/24/15 0346 06/25/15 0315  NA 142 142  K 3.1* 3.5  CL 110 113*  CO2 25 19*  GLUCOSE 121* 92  BUN 12 14  CREATININE 0.55* 0.64  CALCIUM 8.6* 8.6*  MG 2.0 2.0  PHOS 2.5 2.3*   Liver Function Tests:   Recent Labs Lab 06/21/15 1400  AST 39  ALT 24  ALKPHOS 78  BILITOT 1.0  PROT 7.6  ALBUMIN 3.7   CBC:  Recent Labs Lab 06/21/15 1400  06/24/15 0346 06/25/15 0315  WBC 16.1*  < > 14.6* 13.4*  NEUTROABS 13.6*  --   --   --   HGB 15.4  < > 15.0 13.3  HCT 46.7  < > 44.7 40.2  MCV 90.3  < > 88.7 89.1  PLT 254  < > 223 214  < > = values in this interval not displayed. Coagulation:   Recent Labs Lab 06/21/15 1400  LABPROT 14.5  INR 1.11   Cardiac Enzymes: No results for input(s): CKTOTAL, CKMB, CKMBINDEX, TROPONINI in the last 168 hours. Urinalysis: No results for input(s): COLORURINE, LABSPEC, PHURINE, GLUCOSEU, HGBUR, BILIRUBINUR, KETONESUR, PROTEINUR, UROBILINOGEN, NITRITE, LEUKOCYTESUR in the last 168 hours.  Invalid input(s): APPERANCEUR Lipid Panel    Component Value Date/Time   TRIG 55 06/25/2015 0315   HgbA1C No results found for: HGBA1C  Urine Drug Screen:      Component Value Date/Time   LABOPIA NONE DETECTED 06/22/2015 1706   COCAINSCRNUR NONE DETECTED 06/22/2015 1706   LABBENZ POSITIVE* 06/22/2015 1706   AMPHETMU NONE DETECTED 06/22/2015 1706   THCU NONE DETECTED 06/22/2015 1706   LABBARB NONE DETECTED 06/22/2015 1706    Alcohol Level: No results for input(s): ETH in the last 168 hours.  Dg Chest Port 1  View  06/24/2015  CLINICAL DATA:  Status post intracranial hemorrhage diagnosed 06/21/2015. Respiratory failure. Status post extubation. EXAM: PORTABLE CHEST 1 VIEW COMPARISON:  Single view of the chest 06/23/2015 and 06/22/2015. FINDINGS: The patient is rotated on the exam. Endotracheal tube and NG tube have been removed. Left basilar airspace disease persists. Likely the past cm right upper lobe is unchanged. The right lung appears clear. No pneumothorax or pleural effusion. Heart size is normal. IMPRESSION: Persistent left basilar airspace disease could be due to pneumonia or aspiration. Electronically Signed   By: Drusilla Kanner M.D.   On: 06/24/2015 07:43    CT of the brain  Left posterior parietal parenchymal hematoma is identified. Findings compatible with hemorrhagic infarct. Underlying lesion not excluded.  MRI of the brain  3.8 x 4.4 x 4.5 cm lobe large parenchymal hematoma within the left parietal lobe. There is mild localized vasogenic edema with partial effacement of the left lateral ventricle and trace left-to-right shift. No underlying mass lesion or vascular malformation identified. In the absence of trauma, primary differential consideration includes cerebral amyloid angiopathy  MRA of the brain  Negative intracranial MRA. No vascular malformation or aneurysm to account for left parietal lobar hematoma  Carotid Doppler  Pending   2D Echocardiogram  Pending   CXR  06/23/15 :Left lower lobe and right suprahilar interstitial infiltrate compatible with pneum EEG 06/23/15 : 1) Focal left frontotemporal sharp waves 2) focal left frontotemporal delta activity 3) attenuation of frequencies in the left posterior quadrant  Therapy Recommendations pending   Neurological Examination Mental Status: Awake. Alert. Mental status exam limited due to language barrier and patient likely having aphasia. not following commands.  Cranial Nerves: II: no response to visual threatens. bl  post surgical pupils. Corneal reflexes are present. Fundi could not be visualized. III,IV, VI: ptosis not present, extra-ocular motions intact bilaterally  Motor: Withdraws L side > R side. Not following commands.  Tone and bulk:normal tone throughout; no atrophy noted Sensory: Unable to test Deep Tendon Reflexes: 1+ and symmetric throughout Plantars: Right: downgoing   Left: downgoing Cerebellar: Not tested  Gait: not tested.       ASSESSMENT 81 y.o. male with a past medical history significant for alcohol abuse and otherwise unknown PMH, brought in for evaluation of mental state changes. Patient is currently intubated and thus all clinical information was obtained from the medical record. He was taken to the ED " after his family noticed behavior changes.   Had rapidly declining mental status in ER, requiring intubation, CT head revealed large L ICH. Had one witnessed seizure". Patient was intubated for airway protection. Loaded with 1 gram IV keppra.  ICH: - Cortical, not likely HTN related possibly alcohol or amyloid related   -- SBP <180 - SCDs DVT prophylaxis     Seizures: - EEG left frontal sharps but no seizures  - on Keppra- no breakthru seizures  Sedation: - now off ETOH use - thiamine  - folate  Family: family not available at bedside for discussion  - con't aggressive care and full code. Discussed with Dr. Lenna Gilford out of bed. Therapy consults. Transfer  outside ICU to the floor bed.  Hospital day # 4 Telicia Hodgkiss  SIGNED    To contact Stroke Continuity provider, please refer to [email or web address]. After hours, contact General Neurology

## 2015-06-26 DIAGNOSIS — E876 Hypokalemia: Secondary | ICD-10-CM

## 2015-06-26 DIAGNOSIS — I619 Nontraumatic intracerebral hemorrhage, unspecified: Secondary | ICD-10-CM

## 2015-06-26 LAB — BASIC METABOLIC PANEL
ANION GAP: 9 (ref 5–15)
BUN: 7 mg/dL (ref 6–20)
CHLORIDE: 104 mmol/L (ref 101–111)
CO2: 26 mmol/L (ref 22–32)
Calcium: 8.3 mg/dL — ABNORMAL LOW (ref 8.9–10.3)
Creatinine, Ser: 0.55 mg/dL — ABNORMAL LOW (ref 0.61–1.24)
Glucose, Bld: 115 mg/dL — ABNORMAL HIGH (ref 65–99)
POTASSIUM: 2.7 mmol/L — AB (ref 3.5–5.1)
SODIUM: 139 mmol/L (ref 135–145)

## 2015-06-26 LAB — GLUCOSE, CAPILLARY
GLUCOSE-CAPILLARY: 125 mg/dL — AB (ref 65–99)
GLUCOSE-CAPILLARY: 123 mg/dL — AB (ref 65–99)
GLUCOSE-CAPILLARY: 95 mg/dL (ref 65–99)
Glucose-Capillary: 100 mg/dL — ABNORMAL HIGH (ref 65–99)
Glucose-Capillary: 116 mg/dL — ABNORMAL HIGH (ref 65–99)
Glucose-Capillary: 152 mg/dL — ABNORMAL HIGH (ref 65–99)

## 2015-06-26 LAB — PHOSPHORUS: PHOSPHORUS: 2.3 mg/dL — AB (ref 2.5–4.6)

## 2015-06-26 LAB — CBC
HCT: 42.5 % (ref 39.0–52.0)
HEMOGLOBIN: 14.2 g/dL (ref 13.0–17.0)
MCH: 29.5 pg (ref 26.0–34.0)
MCHC: 33.4 g/dL (ref 30.0–36.0)
MCV: 88.2 fL (ref 78.0–100.0)
PLATELETS: 298 10*3/uL (ref 150–400)
RBC: 4.82 MIL/uL (ref 4.22–5.81)
RDW: 13.2 % (ref 11.5–15.5)
WBC: 12.3 10*3/uL — AB (ref 4.0–10.5)

## 2015-06-26 LAB — MAGNESIUM: MAGNESIUM: 1.6 mg/dL — AB (ref 1.7–2.4)

## 2015-06-26 MED ORDER — POTASSIUM CHLORIDE 10 MEQ/100ML IV SOLN
10.0000 meq | INTRAVENOUS | Status: AC
  Administered 2015-06-26 (×4): 10 meq via INTRAVENOUS
  Filled 2015-06-26 (×2): qty 100

## 2015-06-26 MED ORDER — RESOURCE THICKENUP CLEAR PO POWD
ORAL | Status: DC | PRN
  Filled 2015-06-26: qty 125

## 2015-06-26 MED ORDER — MAGNESIUM SULFATE 2 GM/50ML IV SOLN
2.0000 g | Freq: Once | INTRAVENOUS | Status: AC
  Administered 2015-06-26: 2 g via INTRAVENOUS
  Filled 2015-06-26: qty 50

## 2015-06-26 MED ORDER — DEXTROSE 5 % IV SOLN
30.0000 mmol | Freq: Once | INTRAVENOUS | Status: AC
  Administered 2015-06-26: 30 mmol via INTRAVENOUS
  Filled 2015-06-26: qty 10

## 2015-06-26 MED ORDER — POTASSIUM CHLORIDE CRYS ER 20 MEQ PO TBCR
40.0000 meq | EXTENDED_RELEASE_TABLET | ORAL | Status: AC
  Administered 2015-06-26 (×2): 40 meq via ORAL
  Filled 2015-06-26 (×2): qty 2

## 2015-06-26 NOTE — Care Management Important Message (Signed)
Important Message  Patient Details  Name: Rodman CompGualberto Arkin MRN: [id number] Date of Birth: 19-Jun-1927   Medicare Important Message Given:  Yes    Renny Remer P Presten Joost 06/26/2015, 1:52 PM

## 2015-06-26 NOTE — Care Management Note (Signed)
Case Management Note  Patient Details  Name: Seibert Keeter MRN: [id number] Date of Birth: [birth year]  Subjective/Objective: Patient admitted with hematoma of the brain. Patient is from [place], [place] with his family.                    Action/Plan: CM met with the patient and his daughter about inpatient rehab at discharge. Daughter states they would like the patient in a rehab in [place], [place] since that is where they live. CM posed the question: that if they could not find a CIR in the [place] area, that would be able to take Mr Chmiel, would they like [facility] to assess for admission? The daughter states they would not like the patient in [place] and that if we could not place him in a CIR in [place] she would like SNF for rehab in the [place] area. CM informed CSW of the possibility of placement. With the patient and daughter permission patient's information faxed out to [facility] inpatient rehab. CM to continue to follow for discharge needs.   Expected Discharge Date:   (unknown)               Expected Discharge Plan:  [place]  In-House Referral:     Discharge planning Services  CM Consult  Post Acute Care Choice:    Choice offered to:     DME Arranged:    DME Agency:     HH Arranged:    HH Agency:     Status of Service:  In process, will continue to follow  Medicare Important Message Given:    Date Medicare IM Given:    Medicare IM give by:    Date Additional Medicare IM Given:    Additional Medicare Important Message give by:     If discussed at [place] of Stay Meetings, dates discussed:    Additional Comments:  Pollie Friar, RN 06/26/2015, 12:18 PM

## 2015-06-26 NOTE — Progress Notes (Signed)
Stroke Team Progress Note  HISTORY 81 y.o. male with a past medical history significant for alcohol abuse and otherwise unknown PMH, brought in for evaluation of mental state changes. Patient is currently intubated and thus all clinical information was obtained from the medical record. He was taken to the ED " after his family noticed behavior changes. Last seen normal last night, then this morning was noted to be confused, putting shoes on the wrong feet, garbled speech. Had rapidly declining mental status in ER, requiring intubation, CT head revealed large L ICH. Had one witnessed seizure". Patient was intubated for airway protection. Loaded with 1 gram IV keppra.  SUBJECTIVE Now off sedationi  Awake and interactive. Blood pressure adequately controlled. Wife not at the bedside.  OBJECTIVE Most recent Vital Signs: Filed Vitals:   06/26/15 0116 06/26/15 0500 06/26/15 0530 06/26/15 0929  BP: 160/92  156/89 145/80  Pulse: 93  91 98  Temp: 98.4 F (36.9 C)  98.9 F (37.2 C) 98.4 F (36.9 C)  TempSrc: Oral  Oral Oral  Resp: 20  20 20   Height:      Weight:  122 lb 5.7 oz (55.5 kg)    SpO2: 97%  95% 95%   CBG (last 3)   Recent Labs  06/26/15 0357 06/26/15 0814 06/26/15 1120  GLUCAP 100* 116* 152*    IV Fluid Intake:   . sodium chloride 50 mL/hr at 06/25/15 1500    MEDICATIONS  . amLODipine  5 mg Oral Daily  . antiseptic oral rinse  7 mL Mouth Rinse q12n4p  . chlorhexidine  15 mL Mouth Rinse BID  . feeding supplement (ENSURE ENLIVE)  237 mL Oral BID BM  . Influenza vac split quadrivalent PF  0.5 mL Intramuscular Tomorrow-1000  . levETIRAcetam  500 mg Intravenous Q12H  . multivitamin with minerals  1 tablet Oral Daily  . pantoprazole (PROTONIX) IV  40 mg Intravenous QHS  . pneumococcal 23 valent vaccine  0.5 mL Intramuscular Tomorrow-1000  . senna-docusate  1 tablet Oral BID  . sodium phosphate  Dextrose 5% IVPB  30 mmol Intravenous Once   PRN:  acetaminophen **OR**  acetaminophen, labetalol, RESOURCE THICKENUP CLEAR  Diet:  DIET DYS 2 Room service appropriate?: Yes; Fluid consistency:: Honey Thick  Activity:  Bedrest DVT Prophylaxis:  SCDs  CLINICALLY SIGNIFICANT STUDIES Basic Metabolic Panel:   Recent Labs Lab 06/25/15 0315 06/26/15 0415  NA 142 139  K 3.5 2.7*  CL 113* 104  CO2 19* 26  GLUCOSE 92 115*  BUN 14 7  CREATININE 0.64 0.55*  CALCIUM 8.6* 8.3*  MG 2.0 1.6*  PHOS 2.3* 2.3*   Liver Function Tests:   Recent Labs Lab 06/21/15 1400  AST 39  ALT 24  ALKPHOS 78  BILITOT 1.0  PROT 7.6  ALBUMIN 3.7   CBC:  Recent Labs Lab 06/21/15 1400  06/25/15 0315 06/26/15 0415  WBC 16.1*  < > 13.4* 12.3*  NEUTROABS 13.6*  --   --   --   HGB 15.4  < > 13.3 14.2  HCT 46.7  < > 40.2 42.5  MCV 90.3  < > 89.1 88.2  PLT 254  < > 214 298  < > = values in this interval not displayed. Coagulation:   Recent Labs Lab 06/21/15 1400  LABPROT 14.5  INR 1.11   Cardiac Enzymes: No results for input(s): CKTOTAL, CKMB, CKMBINDEX, TROPONINI in the last 168 hours. Urinalysis: No results for input(s): COLORURINE, LABSPEC, PHURINE, GLUCOSEU, HGBUR, BILIRUBINUR, KETONESUR,  PROTEINUR, UROBILINOGEN, NITRITE, LEUKOCYTESUR in the last 168 hours.  Invalid input(s): APPERANCEUR Lipid Panel    Component Value Date/Time   TRIG 55 06/25/2015 0315   HgbA1C No results found for: HGBA1C  Urine Drug Screen:      Component Value Date/Time   LABOPIA NONE DETECTED 06/22/2015 1706   COCAINSCRNUR NONE DETECTED 06/22/2015 1706   LABBENZ POSITIVE* 06/22/2015 1706   AMPHETMU NONE DETECTED 06/22/2015 1706   THCU NONE DETECTED 06/22/2015 1706   LABBARB NONE DETECTED 06/22/2015 1706    Alcohol Level: No results for input(s): ETH in the last 168 hours.  No results found.  CT of the brain  Left posterior parietal parenchymal hematoma is identified. Findings compatible with hemorrhagic infarct. Underlying lesion not excluded.  MRI of the brain  3.8 x  4.4 x 4.5 cm lobe large parenchymal hematoma within the left parietal lobe. There is mild localized vasogenic edema with partial effacement of the left lateral ventricle and trace left-to-right shift. No underlying mass lesion or vascular malformation identified. In the absence of trauma, primary differential consideration includes cerebral amyloid angiopathy  MRA of the brain  Negative intracranial MRA. No vascular malformation or aneurysm to account for left parietal lobar hematoma  Carotid Doppler  Pending   2D Echocardiogram  Pending   CXR  06/23/15 :Left lower lobe and right suprahilar interstitial infiltrate compatible with pneum EEG 06/23/15 : 1) Focal left frontotemporal sharp waves 2) focal left frontotemporal delta activity 3) attenuation of frequencies in the left posterior quadrant  Therapy Recommendations pending   Neurological Examination Mental Status: Awake. Alert. Mental status exam limited due to language barrier and patient likely having aphasia. not following commands.  Cranial Nerves: II: no response to visual threatens. bl post surgical pupils. Corneal reflexes are present. Fundi could not be visualized. III,IV, VI: ptosis not present, extra-ocular motions intact bilaterally  Motor: Withdraws L side > R side. Not following commands.  Tone and bulk:normal tone throughout; no atrophy noted Sensory: Unable to test Deep Tendon Reflexes: 1+ and symmetric throughout Plantars: Right: downgoing   Left: downgoing Cerebellar: Not tested  Gait: not tested.       ASSESSMENT 81 y.o. male with a past medical history significant for alcohol abuse and otherwise unknown PMH, brought in for evaluation of mental state changes. Patient is currently intubated and thus all clinical information was obtained from the medical record. He was taken to the ED " after his family noticed behavior changes.   Had rapidly declining mental status in ER, requiring intubation, CT head  revealed large L ICH. Had one witnessed seizure". Patient was intubated for airway protection. Loaded with 1 gram IV keppra.  ICH: - Cortical, not likely HTN related possibly alcohol or amyloid related   -- SBP <180 - SCDs DVT prophylaxis     Seizures: - EEG left frontal sharps but no seizures  - on Keppra- no breakthru seizures  Sedation: - now off ETOH use - thiamine  - folate  Family: family not available at bedside for discussion  - con't aggressive care and full code. Discussed with Dr. Lenna Gilford out of bed. Therapy consults. Transfer to rehabilitation or the next few days. Stroke service will sign off. Currently call for questions.  Hospital day # 5 Rosali Augello  SIGNED    To contact Stroke Continuity provider, please refer to [email or web address]. After hours, contact General Neurology

## 2015-06-26 NOTE — Progress Notes (Signed)
Interpreter Wyvonnia DuskyGraciela Namihira for PT [employer]

## 2015-06-26 NOTE — Progress Notes (Signed)
Patient Demographics  Charles Bullock, is a 81 y.o. male, DOB - [birth year], ZOX:[id number]  Admit date - 06/21/2015   Admitting Physician Coralyn Helling, MD  Outpatient Primary MD for the patient is No primary care provider on file.  LOS - 5   Chief Complaint  Patient presents with  . Stroke Symptoms  . Seizures       Admission HPI/Brief narrative: 82 year old male with past medical history of alcohol abuse, presents for altered mental status, workup significant for ICH, had seizure in ED, patient required intubation, admitted by PCCM, systole intubated, mentation improving, started on diet..  Subjective:   Rodman Comp today has,  No chest pain, No abdominal pain - No Nausea,No Cough - SOB.  Assessment & Plan    Active Problems:   Intraparenchymal hematoma of brain (HCC)   ICH (intracerebral hemorrhage) (HCC)  Intracranial hemorrhage - Neurology on input appreciated, its cortical, not likely to hypertension, possible alcohol or drug related - Mentation improving, started on diet - Management per neurology   hypertension - Started on Norvasc  Seizures - On Keppra, management per neurology  Alcohol abuse - No signs of withdrawals, continue with thiamine and folic acid  Hypokalemia/hypophosphatemia/hypomagnesemia - Repleted, will recheck in a.m.  Code Status: Full  Family Communication: None at bedside  Disposition Plan: Inpatient rehabilitation at [facility] area when medically stable(1-2 days)   Procedures  intubation/extubation by [facility]  Consults  PCCM>>[facility]  Medications  Scheduled Meds: . amLODipine  5 mg Oral Daily  . antiseptic oral rinse  7 mL Mouth Rinse q12n4p  . chlorhexidine  15 mL Mouth Rinse BID  . feeding supplement (ENSURE ENLIVE)  237 mL Oral BID BM  . Influenza vac split quadrivalent PF  0.5 mL Intramuscular Tomorrow-1000  . levETIRAcetam   500 mg Intravenous Q12H  . multivitamin with minerals  1 tablet Oral Daily  . pantoprazole (PROTONIX) IV  40 mg Intravenous QHS  . pneumococcal 23 valent vaccine  0.5 mL Intramuscular Tomorrow-1000  . potassium chloride  10 mEq Intravenous Q1 Hr x 4  . senna-docusate  1 tablet Oral BID  . sodium phosphate  Dextrose 5% IVPB  30 mmol Intravenous Once   Continuous Infusions: . sodium chloride 50 mL/hr at 06/25/15 1500   PRN Meds:.acetaminophen **OR** acetaminophen, labetalol, RESOURCE THICKENUP CLEAR  DVT Prophylaxis   SCDs   Lab Results  Component Value Date   PLT 298 06/26/2015    Antibiotics    Anti-infectives    None          Objective:   Filed Vitals:   06/26/15 0116 06/26/15 0500 06/26/15 0530 06/26/15 0929  BP: 160/92  156/89 145/80  Pulse: 93  91 98  Temp: 98.4 F (36.9 C)  98.9 F (37.2 C) 98.4 F (36.9 C)  TempSrc: Oral  Oral Oral  Resp: Height:      Weight:  55.5 kg (122 lb 5.7 oz)    SpO2: 97%  95% 95%    Wt Readings from Last 3 Encounters:  06/26/15 55.5 kg (122 lb 5.7 oz)     Intake/Output Summary (Last 24 hours) at 06/26/15 1147 Last data filed at 06/26/15 0932  Gross per 24 hour  Intake    815 ml  Output    375 ml  Net    440 ml     Physical Exam  Awake Alert, follows commands [place] [facility].AT,PERRAL Supple Neck,No JVD, No cervical lymphadenopathy appriciated.  Symmetrical Chest wall movement, Good air movement bilaterally, CTAB RRR,No Gallops,Rubs or new Murmurs, No Parasternal Heave +ve B.Sounds, Abd Soft, No tenderness, No organomegaly appriciated, No rebound - guarding or rigidity. No Cyanosis, Clubbing or edema, No new Rash or bruise     Data Review   Micro Results Recent Results (from the past 240 hour(s))  MRSA PCR Screening     Status: None   Collection Time: 06/21/15  6:24 PM  Result Value Ref Range Status   MRSA by PCR NEGATIVE NEGATIVE Final    Comment:        The GeneXpert MRSA Assay (FDA approved for NASAL  specimens only), is one component of a comprehensive MRSA colonization surveillance program. It is not intended to diagnose MRSA infection nor to guide or monitor treatment for MRSA infections.     Radiology Reports Ct Head Wo Contrast  06/23/2015  CLINICAL DATA:  Altered mental status ICH.  Followup scan. EXAM: CT HEAD WITHOUT CONTRAST TECHNIQUE: Contiguous axial images were obtained from the base of the skull through the vertex without intravenous contrast. COMPARISON:  Yesterday FINDINGS: Skull and Sinuses:The patient's orogastric tube coils in the nasopharynx, where there is also layering fluid. No traumatic osseous finding.  Clear sinuses and mastoids. Visualized orbits: No contributory finding. Bilateral cataract resection. Brain: Lobar hematoma centered in the left parietal lobe has unchanged size and appearance, including hematocrit level. As before, maximal dimensions measure up to 4.5 cm. There is no subarachnoid or intraventricular extension. Unchanged local mass effect on the ventricular system and sulci, without midline shift or herniation. Stable surrounding rim of vasogenic edema. No evidence of acute infarct.  No hydrocephalus. IMPRESSION: 1. Size stable 45mm hematoma in the left cerebral hemisphere. 2. Redundant orogastric tube, coiled in the nasopharynx. Electronically Signed   By: Marnee Spring M.D.   On: 06/23/2015 00:21   Ct Head Wo Contrast  06/21/2015  CLINICAL DATA:  Seizure. EXAM: CT HEAD WITHOUT CONTRAST TECHNIQUE: Contiguous axial images were obtained from the base of the skull through the vertex without intravenous contrast. COMPARISON:  None. FINDINGS: Within the left posterior parietal lobe there is a hyperdense mass with surrounding low attenuation edema. The mass measures 5 x 3.3 cm. This is compatible with an area of hemorrhagic infarct. There is prominence of the sulci and ventricles consistent with brain atrophy. Diffuse low attenuation within the subcortical  and periventricular white matter is identified consistent with chronic microvascular disease. Chronic lacunar infarct is identified within the right basal ganglia. There is no midline shift. No intraventricular hemorrhage. But the paranasal sinuses and mastoid air cells are clear. The calvarium is intact. IMPRESSION: 1. Left posterior parietal parenchymal hematoma is identified. Findings compatible with hemorrhagic infarct. Underlying lesion not excluded. Electronically Signed   By: Signa Kell M.D.   On: 06/21/2015 14:34   Mr Maxine Glenn Head Wo Contrast  06/22/2015  CLINICAL DATA:  Initial evaluation for acute intracranial hemorrhage, seizure. EXAM: MRI HEAD WITHOUT CONTRAST MRA HEAD WITHOUT CONTRAST TECHNIQUE: Multiplanar, multiecho pulse sequences of the brain and surrounding structures were obtained without intravenous contrast. Angiographic images of the head were obtained using MRA technique without contrast. COMPARISON:  Prior CT from 06/21/2015. FINDINGS: MRI HEAD FINDINGS Previously identified large lobar parenchymal hemorrhage centered at the  left parietal lobe again seen. This measures 3.8 x 4.4 x 4.5 cm. Internal fluid fluid level present. Surrounding vasogenic edema with partial effacement on the atrium of the left lateral ventricle. Regional sulcal effacement. No hydrocephalus. Visualized distal cervical segments of the internal carotid arteries are patent with antegrade flow. Petrous, cavernous, and supraclinoid segments are widely patent. M1 segments Basilar cisterns are patent. No significant uncal herniation. No underlying mass lesion identified on this noncontrast examination. Abnormal T2/FLAIR signal with associated restricted diffusion present within the mesial left temporal lobe/left hippocampus, likely related to seizure. No acute vascular infarct. Intracranial vascular flow voids are preserved. Gray-white matter differentiation otherwise maintained. Diffuse prominence of the CSF containing  spaces is compatible with generalized cerebral atrophy. Patchy T2/FLAIR hyperintensity within the periventricular white matter most consistent with chronic small vessel ischemic disease, mild for age. Small remote lacunar infarct within the periventricular white matter of the right corona radiata. Additional small remote lacunar infarcts within the bilateral thalami. Scattered foci of susceptibility artifact within the supratentorial and infratentorial brain on gradient echo sequence, centered around the thalami and and cerebellum present, likely small chronic micro hemorrhages, and most likely related to underlying chronic hypertension. No mass lesion identified.  No extra-axial fluid collection. Craniocervical junction within normal limits. Multilevel degenerative spondylolysis present within the visualized upper cervical spine. Pituitary gland within normal limits. No acute abnormality about the orbits. Sequela prior bilateral lens extraction noted. Scattered mucosal thickening within the maxillary sinuses and ethmoidal air cells. Small amount of opacity within the left mastoid air cells. Inner ear structures grossly normal. Bone marrow signal intensity within normal limits. No scalp soft tissue abnormality. MRA HEAD FINDINGS ANTERIOR CIRCULATION: Visualized distal cervical segments of the internal carotid arteries are widely patent with antegrade flow. The petrous, cavernous, and supraclinoid segments are widely patent. A1 segments, anterior communicating artery, and anterior cerebral arteries are well opacified and normal in appearance. M1 segments widely patent without stenosis or occlusion. MCA bifurcations normal. Distal MCA branches well opacified and symmetric. Possible mild short-segment stenosis within a proximal right M2 branch noted. No vascular malformation seen underlying the left parietal hematoma. POSTERIOR CIRCULATION: Vertebral arteries are patent to the vertebrobasilar junction. Posterior  inferior cerebral artery patent on the right. Left PICA not visualized. Basilar artery widely patent. Superior cerebellar arteries well opacified bilaterally. Both posterior cerebral arteries arise from the basilar artery and are well opacified to their distal aspects. No aneurysm. IMPRESSION: MRI HEAD IMPRESSION: 1. 3.8 x 4.4 x 4.5 cm lobe large parenchymal hematoma within the left parietal lobe. There is mild localized vasogenic edema with partial effacement of the left lateral ventricle and trace left-to-right shift. No underlying mass lesion or vascular malformation identified. In the absence of trauma, primary differential consideration includes cerebral amyloid angiopathy. 2. Abnormal FLAIR and diffusion signal abnormality within the mesial left temporal lobe/left hippocampus, most likely related to seizure. Correlation with EEG recommended. 3. Scattered subcentimeter chronic micro hemorrhages involving the supratentorial and infratentorial brain, most likely related to chronic underlying hypertension. 4. Atrophy with mild chronic small vessel ischemic disease and scattered remote lacunar infarcts as above. MRA HEAD IMPRESSION: Negative intracranial MRA. No vascular malformation or aneurysm to account for left parietal lobar hematoma. Electronically Signed   By: Rise MuBenjamin  McClintock M.D.   On: 06/22/2015 03:04   Mr Brain Wo Contrast  06/22/2015  CLINICAL DATA:  Initial evaluation for acute intracranial hemorrhage, seizure. EXAM: MRI HEAD WITHOUT CONTRAST MRA HEAD WITHOUT CONTRAST TECHNIQUE: Multiplanar, multiecho pulse sequences of  the brain and surrounding structures were obtained without intravenous contrast. Angiographic images of the head were obtained using MRA technique without contrast. COMPARISON:  Prior CT from 06/21/2015. FINDINGS: MRI HEAD FINDINGS Previously identified large lobar parenchymal hemorrhage centered at the left parietal lobe again seen. This measures 3.8 x 4.4 x 4.5 cm. Internal  fluid fluid level present. Surrounding vasogenic edema with partial effacement on the atrium of the left lateral ventricle. Regional sulcal effacement. No hydrocephalus. Visualized distal cervical segments of the internal carotid arteries are patent with antegrade flow. Petrous, cavernous, and supraclinoid segments are widely patent. M1 segments Basilar cisterns are patent. No significant uncal herniation. No underlying mass lesion identified on this noncontrast examination. Abnormal T2/FLAIR signal with associated restricted diffusion present within the mesial left temporal lobe/left hippocampus, likely related to seizure. No acute vascular infarct. Intracranial vascular flow voids are preserved. Gray-white matter differentiation otherwise maintained. Diffuse prominence of the CSF containing spaces is compatible with generalized cerebral atrophy. Patchy T2/FLAIR hyperintensity within the periventricular white matter most consistent with chronic small vessel ischemic disease, mild for age. Small remote lacunar infarct within the periventricular white matter of the right corona radiata. Additional small remote lacunar infarcts within the bilateral thalami. Scattered foci of susceptibility artifact within the supratentorial and infratentorial brain on gradient echo sequence, centered around the thalami and and cerebellum present, likely small chronic micro hemorrhages, and most likely related to underlying chronic hypertension. No mass lesion identified.  No extra-axial fluid collection. Craniocervical junction within normal limits. Multilevel degenerative spondylolysis present within the visualized upper cervical spine. Pituitary gland within normal limits. No acute abnormality about the orbits. Sequela prior bilateral lens extraction noted. Scattered mucosal thickening within the maxillary sinuses and ethmoidal air cells. Small amount of opacity within the left mastoid air cells. Inner ear structures grossly normal.  Bone marrow signal intensity within normal limits. No scalp soft tissue abnormality. MRA HEAD FINDINGS ANTERIOR CIRCULATION: Visualized distal cervical segments of the internal carotid arteries are widely patent with antegrade flow. The petrous, cavernous, and supraclinoid segments are widely patent. A1 segments, anterior communicating artery, and anterior cerebral arteries are well opacified and normal in appearance. M1 segments widely patent without stenosis or occlusion. MCA bifurcations normal. Distal MCA branches well opacified and symmetric. Possible mild short-segment stenosis within a proximal right M2 branch noted. No vascular malformation seen underlying the left parietal hematoma. POSTERIOR CIRCULATION: Vertebral arteries are patent to the vertebrobasilar junction. Posterior inferior cerebral artery patent on the right. Left PICA not visualized. Basilar artery widely patent. Superior cerebellar arteries well opacified bilaterally. Both posterior cerebral arteries arise from the basilar artery and are well opacified to their distal aspects. No aneurysm. IMPRESSION: MRI HEAD IMPRESSION: 1. 3.8 x 4.4 x 4.5 cm lobe large parenchymal hematoma within the left parietal lobe. There is mild localized vasogenic edema with partial effacement of the left lateral ventricle and trace left-to-right shift. No underlying mass lesion or vascular malformation identified. In the absence of trauma, primary differential consideration includes cerebral amyloid angiopathy. 2. Abnormal FLAIR and diffusion signal abnormality within the mesial left temporal lobe/left hippocampus, most likely related to seizure. Correlation with EEG recommended. 3. Scattered subcentimeter chronic micro hemorrhages involving the supratentorial and infratentorial brain, most likely related to chronic underlying hypertension. 4. Atrophy with mild chronic small vessel ischemic disease and scattered remote lacunar infarcts as above. MRA HEAD IMPRESSION:  Negative intracranial MRA. No vascular malformation or aneurysm to account for left parietal lobar hematoma. Electronically Signed   By: Sharlet Salina  Phill Myron M.D.   On: 06/22/2015 03:04   Dg Chest Port 1 View  06/24/2015  CLINICAL DATA:  Status post intracranial hemorrhage diagnosed 06/21/2015. Respiratory failure. Status post extubation. EXAM: PORTABLE CHEST 1 VIEW COMPARISON:  Single view of the chest 06/23/2015 and 06/22/2015. FINDINGS: The patient is rotated on the exam. Endotracheal tube and NG tube have been removed. Left basilar airspace disease persists. Likely the past cm right upper lobe is unchanged. The right lung appears clear. No pneumothorax or pleural effusion. Heart size is normal. IMPRESSION: Persistent left basilar airspace disease could be due to pneumonia or aspiration. Electronically Signed   By: Drusilla Kanner M.D.   On: 06/24/2015 07:43   Dg Chest Port 1 View  06/23/2015  CLINICAL DATA:  Respiratory failure, history of intracranial hemorrhage EXAM: PORTABLE CHEST 1 VIEW COMPARISON:  Portable chest x-ray of June 22, 2015 FINDINGS: The lungs are adequately inflated. There is infiltrate in the left lower lobe partially obscuring the hemidiaphragm. There is patchy density in the right suprahilar region. There is no pleural effusion or pneumothorax. The heart and pulmonary vascularity are normal. The endotracheal tube tip lies approximately 2 cm above the carina. The esophagogastric tube tip projects below the inferior margin of the image. The proximal port is just below the level of the GE junction. IMPRESSION: 1. Left lower lobe and right suprahilar interstitial infiltrate compatible with pneumonia. 2. Withdrawal of the endotracheal tube by 2 cm is recommended. Advancement of the nasogastric tube by 5 cm is recommended. Electronically Signed   By: David  [place]and M.D.   On: 06/23/2015 08:17   Dg Chest Port 1 View  06/22/2015  CLINICAL DATA:  81 year old male with respiratory  failure. EXAM: PORTABLE CHEST 1 VIEW COMPARISON:  06/21/2015 FINDINGS: An endotracheal tube is noted with tip at the carina - recommend 2 cm retraction. Right upper lung volume loss is noted medial right apical opacity. There is no evidence of pleural effusion, pneumothorax or pulmonary edema. No acute bony abnormalities are identified. IMPRESSION: Endotracheal tube with tip at the carina - recommend 2 cm retraction. Continued right upper lobe opacity with volume loss. Although this may represent atelectasis/scarring, chest CT with contrast is recommended if no remote outside studies are available to assess stability. Results were called by telephone at the time of interpretation on 06/22/2015 at 8:41 am to Dr. Craige Cotta , who verbally acknowledged these results. Electronically Signed   By: Harmon Pier M.D.   On: 06/22/2015 08:42   Dg Chest Port 1 View  06/21/2015  CLINICAL DATA:  Status post intubation EXAM: PORTABLE CHEST 1 VIEW COMPARISON:  None. FINDINGS: The ET tube tip is at the level of the carina and is directed towards the right mainstem bronchus. Consider retracting tube by 1-2 cm. Heart size is normal. There is no pleural fluid. Asymmetric opacification of the right apex and right upper lobe scar or atelectasis is noted. IMPRESSION: 1. ET tube tip is at the carina and directed towards the right mainstem bronchus. Consider retracting by 1-2 cm. 2. Asymmetric opacification of the right apex, etiology uncertain. Suggest further evaluation with CT chest as patient's clinical condition tolerates. These results will be called to the ordering clinician or representative by the Radiologist Assistant, and communication documented in the PACS or zVision Dashboard. Electronically Signed   By: Signa Kell M.D.   On: 06/21/2015 15:18   Dg Abd Portable 1v  06/22/2015  CLINICAL DATA:  Check NG placement EXAM: PORTABLE ABDOMEN - 1 VIEW COMPARISON:  None. FINDINGS: Gastric catheter is noted within the stomach.  Scattered large and small bowel gas is seen. Degenerative changes of lumbar spine are noted. IMPRESSION: Gastric catheter within the stomach. Electronically Signed   By: Alcide Clever M.D.   On: 06/22/2015 11:19     CBC  Recent Labs Lab 06/21/15 1400  06/22/15 0315 06/23/15 0220 06/24/15 0346 06/25/15 0315 06/26/15 0415  WBC 16.1*  --  12.5* 15.2* 14.6* 13.4* 12.3*  HGB 15.4  < > 14.6 14.4 15.0 13.3 14.2  HCT 46.7  < > 43.3 43.6 44.7 40.2 42.5  PLT 254  --  255 268 223 214 298  MCV 90.3  --  88.0 88.6 88.7 89.1 88.2  MCH 29.8  --  29.7 29.3 29.8 29.5 29.5  MCHC 33.0  --  33.7 33.0 33.6 33.1 33.4  RDW 13.2  --  13.1 13.3 13.2 13.6 13.2  LYMPHSABS 1.7  --   --   --   --   --   --   MONOABS 0.7  --   --   --   --   --   --   EOSABS 0.0  --   --   --   --   --   --   BASOSABS 0.0  --   --   --   --   --   --   < > = values in this interval not displayed.  Chemistries   Recent Labs Lab 06/21/15 1400  06/22/15 0315 06/23/15 0220 06/24/15 0346 06/25/15 0315 06/26/15 0415  NA 136  < > 134* 138 142 142 139  K 4.0  < > 4.1 3.9 3.1* 3.5 2.7*  CL 101  < > 101 107 110 113* 104  CO2 28  --  19* 26  GLUCOSE 141*  < > 151* 102* 121* 92 115*  BUN 11  < > CREATININE 0.68  < > 0.60* 0.63 0.55* 0.64 0.55*  CALCIUM 9.0  --  9.1 9.0 8.6* 8.6* 8.3*  MG  --   --   --   --  2.0 2.0 1.6*  AST 39  --   --   --   --   --   --   ALT 24  --   --   --   --   --   --   ALKPHOS 78  --   --   --   --   --   --   BILITOT 1.0  --   --   --   --   --   --   < > = values in this interval not displayed. ------------------------------------------------------------------------------------------------------------------ estimated creatinine clearance is 52 mL/min (by C-G formula based on Cr of 0.55). ------------------------------------------------------------------------------------------------------------------ No results for input(s): HGBA1C in the last 72  hours. ------------------------------------------------------------------------------------------------------------------  Recent Labs  06/25/15 0315  TRIG 55   ------------------------------------------------------------------------------------------------------------------ No results for input(s): TSH, T4TOTAL, T3FREE, THYROIDAB in the last 72 hours.  Invalid input(s): FREET3 ------------------------------------------------------------------------------------------------------------------ No results for input(s): VITAMINB12, FOLATE, FERRITIN, TIBC, IRON, RETICCTPCT in the last 72 hours.  Coagulation profile  Recent Labs Lab 06/21/15 1400  INR 1.11    No results for input(s): DDIMER in the last 72 hours.  Cardiac Enzymes No results for input(s): CKMB, TROPONINI, MYOGLOBIN in the last 168 hours.  Invalid input(s): CK ------------------------------------------------------------------------------------------------------------------ Invalid input(s): POCBNP     Time Spent in minutes   25  minutes   Epsie Walthall M.D on 06/26/2015 at 11:47 AM  Between 7am to 7pm - Pager - [phone]  After 7pm go to [email or web address] - password [facility]  [facility]   Office  [phone]

## 2015-06-26 NOTE — Progress Notes (Signed)
Interpreter Charles Bullock for RN Ihor Gullyeesee

## 2015-06-26 NOTE — Progress Notes (Signed)
Rehab Admissions Coordinator Note:  Patient was screened by Trish MageLogue, Karson Reede M for appropriateness for an Inpatient Acute Rehab Consult.  At this time, we are recommending inpatient rehab in [place] area per family request.  Call me for questions.  Trish MageLogue, Thiago Ragsdale M 06/26/2015, 8:40 AM  I can be reached at [phone].

## 2015-06-26 NOTE — Progress Notes (Signed)
Speech Language Pathology Treatment: Dysphagia;Cognitive-Linquistic  Patient Details Name: Charles Bullock MRN: [id number] DOB: May 23, 1927 Today's Date: 06/26/2015 Time: [id number] SLP Time Calculation (min) (ACUTE ONLY): 45 min  Assessment / Plan / Recommendation Clinical Impression  SLP provided assistance and attempted to facilitate functional communication during meal. Pt observed with thin liquids with late coughing indicative of aspiration, honey thick liquids also showed some occasional throat clearing, but otherwise tolerates well. Provided hand over hand assist fading to moderate tactile cues for self feeding and total assist to remove pocketed solids in right buccal cavity. Will downgrade solids to ground to facilitate oral clearance, continue honey thick liquids.  Pt much more verbal today,intelligible, prosodic, fluent and grammatically intact, though totally off topic and perseverative (pt talked incessantly about how he is married, has never gotten involved with another woman, but also that he would only marry out of necessity to woman, "maybe for money." Pt has no awareness of deficits. Receptive ability still completely absent with no recognition of any commands or automatic language from SLP unless accompanied by max visual and contextual cues.  Attention to functional tasks more impaired today, particularly in right visual field. Function now not particularly consistent with either a global or wernicke's aphasia. SLP will continue efforts.    HPI HPI: 81 yo male with reported hx of ETOH presented with acute encephalopathy from ICH, seizure, and HTN emergency.  Intubated 12/17-19.  CT large left posterior parietal parenchymal hematoma.      SLP Plan        Recommendations  Diet recommendations: Dysphagia 2 (fine chop);Honey-thick liquid Liquids provided via: Cup Supervision: Staff to assist with self feeding Compensations: Minimize environmental distractions;Slow rate;Small  sips/bites;Lingual sweep for clearance of pocketing Postural Changes and/or Swallow Maneuvers: Seated upright 90 degrees              General recommendations: Rehab consult Oral Care Recommendations: Oral care QID Follow up Recommendations: Inpatient Rehab   Claudine MoutonDeBlois, Charles Bullock 06/26/2015, 9:07 AM

## 2015-06-26 NOTE — Progress Notes (Signed)
Physical Therapy Treatment Patient Details Name: Charles Bullock MRN: [id number] DOB: 1926/09/23 Today's Date: 06/26/2015    History of Present Illness 81 yo male with reported hx of ETOH presented with acute encephalopathy from ICH, seizure, and HTN emergency. Intubated 12/17-19. CT large left posterior parietal parenchymal hematoma.    PT Comments    Patient remains aphasic. Responds to multi-modal cues (primarily to visual and tactile). In-person interpreter used with pt making no attempts to speak and not following any purely verbal commands. Decreased awareness of his balance deficits as he demonstrates no righting reactions when losing his balance.   Follow Up Recommendations  CIR (in [place] area near family)     Equipment Recommendations  None recommended by PT    Recommendations for Other Services       Precautions / Restrictions Precautions Precautions: Fall Restrictions Weight Bearing Restrictions: No    Mobility  Bed Mobility Overal bed mobility: Needs Assistance Bed Mobility: Supine to Sit     Supine to sit: Min guard     General bed mobility comments: multi-modal cues (including interpreter speaking spanish)   Transfers Overall transfer level: Needs assistance Equipment used: 1 person hand held assist Transfers: Sit to/from Stand Sit to Stand: Min assist            Ambulation/Gait Ambulation/Gait assistance: Min assist;+2 safety/equipment Ambulation Distance (Feet): 100 Feet Assistive device: 1 person hand held assist Gait Pattern/deviations: Step-through pattern;Decreased stride length;Drifts right/left;Wide base of support (very "bow-legged" and walks on lateral borders of feet) Gait velocity: unable to incr velocity significantly despite tactile cues Gait velocity interpretation: Below normal speed for age/gender General Gait Details: drifts to his Rt with no attempts to correct   Stairs            Wheelchair Mobility     Modified Rankin (Stroke Patients Only) Modified Rankin (Stroke Patients Only) Pre-Morbid Rankin Score: No symptoms (Unsure, but assumed based on chart review.) Modified Rankin: Moderately severe disability     Balance     Sitting balance-Leahy Scale: Poor       Standing balance-Leahy Scale: Poor                      Cognition Arousal/Alertness: Awake/alert Behavior During Therapy: WFL for tasks assessed/performed Overall Cognitive Status: Difficult to assess                      Exercises      General Comments        Pertinent Vitals/Pain Pain Assessment: Faces Faces Pain Scale: No hurt    Home Living                      Prior Function            PT Goals (current goals can now be found in the care plan section) Acute Rehab PT Goals Patient Stated Goal: patient global aphasia unable to participate Time For Goal Achievement: 07/09/15 Progress towards PT goals: Progressing toward goals    Frequency  Min 4X/week    PT Plan Current plan remains appropriate    Co-evaluation PT/OT/SLP Co-Evaluation/Treatment: Yes           End of Session Equipment Utilized During Treatment: Gait belt Activity Tolerance: Patient tolerated treatment well Patient left: in chair;with call bell/phone within reach;with chair alarm set;with nursing/sitter in room     Time: 0944-1000 PT Time Calculation (min) (ACUTE ONLY): 16 min  Charges:  $  Gait Training: 8-22 mins                    G Codes:      Charles Bullock Jul 20, 2015, 11:10 AM Pager [phone]

## 2015-06-27 DIAGNOSIS — R569 Unspecified convulsions: Secondary | ICD-10-CM

## 2015-06-27 DIAGNOSIS — I10 Essential (primary) hypertension: Secondary | ICD-10-CM

## 2015-06-27 LAB — BASIC METABOLIC PANEL
Anion gap: 9 (ref 5–15)
BUN: 9 mg/dL (ref 6–20)
CO2: 26 mmol/L (ref 22–32)
CREATININE: 0.51 mg/dL — AB (ref 0.61–1.24)
Calcium: 8.8 mg/dL — ABNORMAL LOW (ref 8.9–10.3)
Chloride: 104 mmol/L (ref 101–111)
GFR calc Af Amer: >60 mL/min (ref >60)
GLUCOSE: 99 mg/dL (ref 65–99)
Potassium: 3.2 mmol/L — ABNORMAL LOW (ref 3.5–5.1)
SODIUM: 139 mmol/L (ref 135–145)

## 2015-06-27 LAB — MAGNESIUM: MAGNESIUM: 1.7 mg/dL (ref 1.7–2.4)

## 2015-06-27 LAB — GLUCOSE, CAPILLARY
Glucose-Capillary: 103 mg/dL — ABNORMAL HIGH (ref 65–99)
Glucose-Capillary: 125 mg/dL — ABNORMAL HIGH (ref 65–99)
Glucose-Capillary: 93 mg/dL (ref 65–99)

## 2015-06-27 LAB — PHOSPHORUS: Phosphorus: 2.6 mg/dL (ref 2.5–4.6)

## 2015-06-27 MED ORDER — FOLIC ACID 1 MG PO TABS: 1.0000 mg | ORAL_TABLET | Freq: Every day | ORAL | Status: AC

## 2015-06-27 MED ORDER — SENNOSIDES-DOCUSATE SODIUM 8.6-50 MG PO TABS: 1.0000 | ORAL_TABLET | Freq: Two times a day (BID) | ORAL | Status: AC

## 2015-06-27 MED ORDER — PANTOPRAZOLE SODIUM 40 MG PO TBEC: 40.0000 mg | DELAYED_RELEASE_TABLET | Freq: Every day | ORAL | Status: DC

## 2015-06-27 MED ORDER — RESOURCE THICKENUP CLEAR PO POWD: ORAL | Status: AC

## 2015-06-27 MED ORDER — AMLODIPINE BESYLATE 10 MG PO TABS: 10.0000 mg | ORAL_TABLET | Freq: Every day | ORAL | Status: AC

## 2015-06-27 MED ORDER — POTASSIUM CHLORIDE CRYS ER 20 MEQ PO TBCR: 20.0000 meq | EXTENDED_RELEASE_TABLET | Freq: Every day | ORAL | Status: AC

## 2015-06-27 MED ORDER — MAGNESIUM SULFATE IN D5W 10-5 MG/ML-% IV SOLN
1.0000 g | Freq: Once | INTRAVENOUS | Status: AC
  Administered 2015-06-27: 1 g via INTRAVENOUS
  Filled 2015-06-27: qty 100

## 2015-06-27 MED ORDER — K PHOS MONO-SOD PHOS DI & MONO 155-852-130 MG PO TABS: 250.0000 mg | ORAL_TABLET | Freq: Two times a day (BID) | ORAL | Status: AC

## 2015-06-27 MED ORDER — ACETAMINOPHEN 325 MG PO TABS: 650.0000 mg | ORAL_TABLET | ORAL | Status: AC | PRN

## 2015-06-27 MED ORDER — K PHOS MONO-SOD PHOS DI & MONO 155-852-130 MG PO TABS
250.0000 mg | ORAL_TABLET | Freq: Two times a day (BID) | ORAL | Status: DC
  Administered 2015-06-27: 250 mg via ORAL
  Filled 2015-06-27 (×2): qty 1

## 2015-06-27 MED ORDER — LEVETIRACETAM 500 MG PO TABS
500.0000 mg | ORAL_TABLET | Freq: Two times a day (BID) | ORAL | Status: DC
  Administered 2015-06-27: 500 mg via ORAL
  Filled 2015-06-27: qty 1

## 2015-06-27 MED ORDER — ADULT MULTIVITAMIN W/MINERALS CH: 1.0000 | ORAL_TABLET | Freq: Every day | ORAL | Status: AC

## 2015-06-27 MED ORDER — POTASSIUM CHLORIDE CRYS ER 20 MEQ PO TBCR
40.0000 meq | EXTENDED_RELEASE_TABLET | ORAL | Status: AC
  Administered 2015-06-27 (×2): 40 meq via ORAL
  Filled 2015-06-27 (×2): qty 2

## 2015-06-27 MED ORDER — LEVETIRACETAM 500 MG PO TABS: 500.0000 mg | ORAL_TABLET | Freq: Two times a day (BID) | ORAL | Status: AC

## 2015-06-27 MED ORDER — VITAMIN B-1 100 MG PO TABS: 100.0000 mg | ORAL_TABLET | Freq: Every day | ORAL | Status: AC

## 2015-06-27 MED ORDER — ENSURE ENLIVE PO LIQD: 237.0000 mL | Freq: Two times a day (BID) | ORAL | Status: AC

## 2015-06-27 NOTE — Progress Notes (Signed)
[facility] for [facility] to Mr Charles Bougieonce. Information they requested was faxed to the facility at: [phone]. Due to the transportation cost family decided to transport the patient to the facility. [facility] IR stated the patient needed to be at the facility by 3pm. Patients daughter made aware and arranged transportation. Daughter made aware to call facility if any issues that would delay their arrival.

## 2015-06-27 NOTE — Discharge Instructions (Signed)
Follow with Primary MD after discharge from facility  Get CBC, CMP,  Primary MD next visit.    Activity: As tolerated with Full fall precautions use walker/cane & assistance as needed - Patient is a fall risk   Disposition inpatient rehabilitation   Diet: Dysphagia to with honey thick fluids , with feeding assistance and aspiration precautions.  On your next visit with your primary care physician please Get Medicines reviewed and adjusted.   Please request your Prim.MD to go over all Hospital Tests and Procedure/Radiological results at the follow up, please get all Hospital records sent to your Prim MD by signing hospital release before you go home.   If you experience worsening of your admission symptoms, develop shortness of breath, life threatening emergency, suicidal or homicidal thoughts you must seek medical attention immediately by calling 911 or calling your MD immediately  if symptoms less severe.  You Must read complete instructions/literature along with all the possible adverse reactions/side effects for all the Medicines you take and that have been prescribed to you. Take any new Medicines after you have completely understood and accpet all the possible adverse reactions/side effects.   Do not drive, operating heavy machinery, perform activities at heights, swimming or participation in water activities or provide baby sitting services if your were admitted for syncope or siezures until you have seen by Primary MD or a Neurologist and advised to do so again.  Do not drive when taking Pain medications.    Do not take more than prescribed Pain, Sleep and Anxiety Medications  Special Instructions: If you have smoked or chewed Tobacco  in the last 2 yrs please stop smoking, stop any regular Alcohol  and or any Recreational drug use.  Wear Seat belts while driving.   Please note  You were cared for by a hospitalist during your hospital stay. If you have any questions about  your discharge medications or the care you received while you were in the hospital after you are discharged, you can call the unit and asked to speak with the hospitalist on call if the hospitalist that took care of you is not available. Once you are discharged, your primary care physician will handle any further medical issues. Please note that NO REFILLS for any discharge medications will be authorized once you are discharged, as it is imperative that you return to your primary care physician (or establish a relationship with a primary care physician if you do not have one) for your aftercare needs so that they can reassess your need for medications and monitor your lab values.

## 2015-06-27 NOTE — Progress Notes (Signed)
Occupational Therapy Treatment Patient Details Name: Charles Bullock MRN: [id number] DOB: [birth year] Today's Date: 06/27/2015    History of present illness 81 yo male with reported hx of ETOH presented with acute encephalopathy from ICH, seizure, and HTN emergency. Intubated 12/17-19. CT large left posterior parietal parenchymal hematoma.   OT comments  Pt continues to demonstrate perseveration and require multimodal cues for OOB mobility. Hand over hand assistance required for grooming activities.   Follow Up Recommendations  CIR;Supervision/Assistance - 24 hour    Equipment Recommendations       Recommendations for Other Services      Precautions / Restrictions Precautions Precautions: Fall       Mobility Bed Mobility Overal bed mobility: Needs Assistance Bed Mobility: Supine to Sit     Supine to sit: Min assist     General bed mobility comments: min assist to initiate, then pt able to complete with min guard  Transfers Overall transfer level: Needs assistance Equipment used: 1 person hand held assist Transfers: Sit to/from [employer] Sit to Stand: Min assist Stand pivot transfers: Min assist       General transfer comment: multimodal cues required for bed to chair transfer    Balance                                   ADL Overall ADL's : Needs assistance/impaired     Grooming: Wash/dry hands;Oral care;Brushing hair;Maximal assistance;Sitting Grooming Details (indicate cue type and reason): pt continued active after hand over hand assist to initiate                                      Vision                     Perception     Praxis      Cognition   Behavior During Therapy:  (perseverative) Overall Cognitive Status: Impaired/Different from baseline Area of Impairment: Following commands;Safety/judgement;Awareness        Following Commands: Follows one step commands with increased time  (and multimodal cues) Safety/Judgement: Decreased awareness of safety;Decreased awareness of deficits     General Comments: pt verbally and physically perseverative    Extremity/Trunk Assessment               Exercises     Shoulder Instructions       General Comments      Pertinent Vitals/ Pain       Pain Assessment: Faces Faces Pain Scale: No hurt  Home Living                                          Prior Functioning/Environment              Frequency Min 2X/week     Progress Toward Goals  OT Goals(current goals can now be found in the care plan section)  Progress towards OT goals: Progressing toward goals  Acute Rehab OT Goals Patient Stated Goal: patient global aphasia unable to participate Potential to Achieve Goals: Good  Plan Discharge plan remains appropriate    Co-evaluation                 End of Session Equipment Utilized During  Treatment: Gait belt   Activity Tolerance Patient tolerated treatment well   Patient Left in chair;with call bell/phone within reach;with nursing/sitter in room   Nurse Communication          Time: [phone] OT Time Calculation (min): 29 min  Charges: OT General Charges $OT Visit: 1 Procedure OT Treatments $Self Care/Home Management : 23-37 mins  Evern BioMayberry, Montague Corella Lynn 06/27/2015, 9:21 AM  [phone]

## 2015-06-27 NOTE — Progress Notes (Signed)
Interpreter Wyvonnia DuskyGraciela Namihira for [employer]

## 2015-06-27 NOTE — Discharge Summary (Addendum)
Charles Bullock, is a 81 y.o. male  DOB Mar 01, 1927  MRN [id number].  Admission date:  06/21/2015  Admitting Physician  Coralyn Helling, MD  Discharge Date:  06/27/2015   Primary MD  No primary care provider on file.  Recommendations for primary care physician for things to follow:  - please check CBC, BMP, phosphorus and magnesium in 3 days. -  will need further speech and swallow evaluation to see if appropriate to advance diet and facility, currently he is on dysphagia 2, Honey thick fluid.   Admission Diagnosis  Respiratory failure (HCC) [J96.90] Malignant hypertension [I10] Seizure (HCC) [R56.9] ICH (intracerebral hemorrhage) (HCC) [I61.9] Hypoxia [R09.02] Intraparenchymal hemorrhage of brain (HCC) [I61.9]   Discharge Diagnosis  Respiratory failure (HCC) [J96.90] Malignant hypertension [I10] Seizure (HCC) [R56.9] ICH (intracerebral hemorrhage) (HCC) [I61.9] Hypoxia [R09.02] Intraparenchymal hemorrhage of brain (HCC) [I61.9]    Active Problems:   Intraparenchymal hematoma of brain (HCC)   ICH (intracerebral hemorrhage) (HCC)   Intraparenchymal hemorrhage of brain (HCC)   Seizure (HCC)   Malignant hypertension      Past Medical History  Diagnosis Date  . ETOH abuse     History reviewed. No pertinent past surgical history.     History of present illness and  Hospital Course:     Kindly see H&P for history of present illness and admission details, please review complete Labs, Consult reports and Test reports for all details in brief  HPI  from the history and physical done on the day of admission 06/21/2015 81yo spanish speaking male with hx heavy ETOH, otherwise unknown PMH presented 12/17 after his family noticed behavior changes. Last seen normal last night, then this morning was noted to be confused, putting shoes on the wrong feet, garbled speech. Had rapidly declining mental  status in ER, requiring intubation, CT head revealed large L ICH. Had one witness seizure, none further noted. PCCM called for ICU admission.     Hospital Course  81 year old male with past medical history of alcohol abuse, presents for altered mental status, workup significant for large left intracranial hemorrhage , had seizure in ED, patient required intubation, admitted to ICU by critical care service, prior to intubation for airway protection, successfully extubated, continues to improve, and by SLP, passed swallow evaluation, dysphagia to honey thick fluids, remains with significant aphasia.  Intracranial hemorrhage - Neurology input appreciated,  Cortical bleed, not likely due to hypertension, possible alcohol or Amyloid related - Mentation and is to improve, passed swallow evaluation. - Will need further assessment by speech/swallow service at facility. - Plan for inpatient rehabilitation discharge. - Patient is high risk for fall   hypertension - Taking any home medication , started on amlodipine with good blood pressure control .  Seizures - Was likely related to intracranial hemorrhage - EEG left frontal sharps but no seizures  - on Keppra- no breakthru seizures, continue with Keppra on discharge  Alcohol abuse - No signs of withdrawals, continue with thiamine and folic acid  Hypokalemia/hypophosphatemia/hypomagnesemia - Repleted, we'll discharge on  oral potassium and phosphorus supplement  Discharge Condition:  Stable       Discharge Instructions  and  Discharge Medications         Discharge Instructions    Discharge instructions    Complete by:  As directed   Follow with Primary MD after discharge from facility  Get CBC, CMP,  Primary MD next visit.    Activity: As tolerated with Full fall precautions use walker/cane & assistance as needed - Patient is a fall risk   Disposition inpatient rehabilitation   Diet: Dysphagia to with honey thick  fluids , with feeding assistance and aspiration precautions.  On your next visit with your primary care physician please Get Medicines reviewed and adjusted.   Please request your Prim.MD to go over all Hospital Tests and Procedure/Radiological results at the follow up, please get all Hospital records sent to your Prim MD by signing hospital release before you go home.   If you experience worsening of your admission symptoms, develop shortness of breath, life threatening emergency, suicidal or homicidal thoughts you must seek medical attention immediately by calling 911 or calling your MD immediately  if symptoms less severe.  You Must read complete instructions/literature along with all the possible adverse reactions/side effects for all the Medicines you take and that have been prescribed to you. Take any new Medicines after you have completely understood and accpet all the possible adverse reactions/side effects.   Do not drive, operating heavy machinery, perform activities at heights, swimming or participation in water activities or provide baby sitting services if your were admitted for syncope or siezures until you have seen by Primary MD or a Neurologist and advised to do so again.  Do not drive when taking Pain medications.    Do not take more than prescribed Pain, Sleep and Anxiety Medications  Special Instructions: If you have smoked or chewed Tobacco  in the last 2 yrs please stop smoking, stop any regular Alcohol  and or any Recreational drug use.  Wear Seat belts while driving.   Please note  You were cared for by a hospitalist during your hospital stay. If you have any questions about your discharge medications or the care you received while you were in the hospital after you are discharged, you can call the unit and asked to speak with the hospitalist on call if the hospitalist that took care of you is not available. Once you are discharged, your primary care physician will  handle any further medical issues. Please note that NO REFILLS for any discharge medications will be authorized once you are discharged, as it is imperative that you return to your primary care physician (or establish a relationship with a primary care physician if you do not have one) for your aftercare needs so that they can reassess your need for medications and monitor your lab values.            Medication List    TAKE these medications        acetaminophen 325 MG tablet  Commonly known as:  TYLENOL  Take 2 tablets (650 mg total) by mouth every 4 (four) hours as needed for mild pain (or temp > 99 F).     amLODipine 10 MG tablet  Commonly known as:  NORVASC  Take 1 tablet (10 mg total) by mouth daily.     feeding supplement (ENSURE ENLIVE) Liqd  Take 237 mLs by mouth 2 (two) times daily between meals. Nectar thick consistency  folic acid 1 MG tablet  Commonly known as:  FOLVITE  Take 1 tablet (1 mg total) by mouth daily.     levETIRAcetam 500 MG tablet  Commonly known as:  KEPPRA  Take 1 tablet (500 mg total) by mouth 2 (two) times daily.     multivitamin with minerals Tabs tablet  Take 1 tablet by mouth daily.     phosphorus 155-852-130 MG tablet  Commonly known as:  K PHOS NEUTRAL  Take 1 tablet (250 mg total) by mouth 2 (two) times daily.     potassium chloride SA 20 MEQ tablet  Commonly known as:  K-DUR,KLOR-CON  Take 1 tablet (20 mEq total) by mouth daily.     RESOURCE THICKENUP CLEAR Powd  Used with all fluids for nectar thick consistency     senna-docusate 8.6-50 MG tablet  Commonly known as:  Senokot-S  Take 1 tablet by mouth 2 (two) times daily.     thiamine 100 MG tablet  Commonly known as:  VITAMIN B-1  Take 1 tablet (100 mg total) by mouth daily.          Diet and Activity recommendation: See Discharge Instructions above   Consults obtained -  PCCM>>[facility] 06/26/2015 neurology  Major procedures and Radiology Reports - PLEASE review  detailed and final reports for all details, in brief -      Ct Head Wo Contrast  06/23/2015  CLINICAL DATA:  Altered mental status ICH.  Followup scan. EXAM: CT HEAD WITHOUT CONTRAST TECHNIQUE: Contiguous axial images were obtained from the base of the skull through the vertex without intravenous contrast. COMPARISON:  Yesterday FINDINGS: Skull and Sinuses:The patient's orogastric tube coils in the nasopharynx, where there is also layering fluid. No traumatic osseous finding.  Clear sinuses and mastoids. Visualized orbits: No contributory finding. Bilateral cataract resection. Brain: Lobar hematoma centered in the left parietal lobe has unchanged size and appearance, including hematocrit level. As before, maximal dimensions measure up to 4.5 cm. There is no subarachnoid or intraventricular extension. Unchanged local mass effect on the ventricular system and sulci, without midline shift or herniation. Stable surrounding rim of vasogenic edema. No evidence of acute infarct.  No hydrocephalus. IMPRESSION: 1. Size stable 45mm hematoma in the left cerebral hemisphere. 2. Redundant orogastric tube, coiled in the nasopharynx. Electronically Signed   By: Marnee Spring M.D.   On: 06/23/2015 00:21   Ct Head Wo Contrast  06/21/2015  CLINICAL DATA:  Seizure. EXAM: CT HEAD WITHOUT CONTRAST TECHNIQUE: Contiguous axial images were obtained from the base of the skull through the vertex without intravenous contrast. COMPARISON:  None. FINDINGS: Within the left posterior parietal lobe there is a hyperdense mass with surrounding low attenuation edema. The mass measures 5 x 3.3 cm. This is compatible with an area of hemorrhagic infarct. There is prominence of the sulci and ventricles consistent with brain atrophy. Diffuse low attenuation within the subcortical and periventricular white matter is identified consistent with chronic microvascular disease. Chronic lacunar infarct is identified within the right basal ganglia.  There is no midline shift. No intraventricular hemorrhage. But the paranasal sinuses and mastoid air cells are clear. The calvarium is intact. IMPRESSION: 1. Left posterior parietal parenchymal hematoma is identified. Findings compatible with hemorrhagic infarct. Underlying lesion not excluded. Electronically Signed   By: Signa Kell M.D.   On: 06/21/2015 14:34   Mr Maxine Glenn Head Wo Contrast  06/22/2015  CLINICAL DATA:  Initial evaluation for acute intracranial hemorrhage, seizure. EXAM: MRI HEAD WITHOUT CONTRAST MRA  HEAD WITHOUT CONTRAST TECHNIQUE: Multiplanar, multiecho pulse sequences of the brain and surrounding structures were obtained without intravenous contrast. Angiographic images of the head were obtained using MRA technique without contrast. COMPARISON:  Prior CT from 06/21/2015. FINDINGS: MRI HEAD FINDINGS Previously identified large lobar parenchymal hemorrhage centered at the left parietal lobe again seen. This measures 3.8 x 4.4 x 4.5 cm. Internal fluid fluid level present. Surrounding vasogenic edema with partial effacement on the atrium of the left lateral ventricle. Regional sulcal effacement. No hydrocephalus. Visualized distal cervical segments of the internal carotid arteries are patent with antegrade flow. Petrous, cavernous, and supraclinoid segments are widely patent. M1 segments Basilar cisterns are patent. No significant uncal herniation. No underlying mass lesion identified on this noncontrast examination. Abnormal T2/FLAIR signal with associated restricted diffusion present within the mesial left temporal lobe/left hippocampus, likely related to seizure. No acute vascular infarct. Intracranial vascular flow voids are preserved. Gray-white matter differentiation otherwise maintained. Diffuse prominence of the CSF containing spaces is compatible with generalized cerebral atrophy. Patchy T2/FLAIR hyperintensity within the periventricular white matter most consistent with chronic small  vessel ischemic disease, mild for age. Small remote lacunar infarct within the periventricular white matter of the right corona radiata. Additional small remote lacunar infarcts within the bilateral thalami. Scattered foci of susceptibility artifact within the supratentorial and infratentorial brain on gradient echo sequence, centered around the thalami and and cerebellum present, likely small chronic micro hemorrhages, and most likely related to underlying chronic hypertension. No mass lesion identified.  No extra-axial fluid collection. Craniocervical junction within normal limits. Multilevel degenerative spondylolysis present within the visualized upper cervical spine. Pituitary gland within normal limits. No acute abnormality about the orbits. Sequela prior bilateral lens extraction noted. Scattered mucosal thickening within the maxillary sinuses and ethmoidal air cells. Small amount of opacity within the left mastoid air cells. Inner ear structures grossly normal. Bone marrow signal intensity within normal limits. No scalp soft tissue abnormality. MRA HEAD FINDINGS ANTERIOR CIRCULATION: Visualized distal cervical segments of the internal carotid arteries are widely patent with antegrade flow. The petrous, cavernous, and supraclinoid segments are widely patent. A1 segments, anterior communicating artery, and anterior cerebral arteries are well opacified and normal in appearance. M1 segments widely patent without stenosis or occlusion. MCA bifurcations normal. Distal MCA branches well opacified and symmetric. Possible mild short-segment stenosis within a proximal right M2 branch noted. No vascular malformation seen underlying the left parietal hematoma. POSTERIOR CIRCULATION: Vertebral arteries are patent to the vertebrobasilar junction. Posterior inferior cerebral artery patent on the right. Left PICA not visualized. Basilar artery widely patent. Superior cerebellar arteries well opacified bilaterally. Both  posterior cerebral arteries arise from the basilar artery and are well opacified to their distal aspects. No aneurysm. IMPRESSION: MRI HEAD IMPRESSION: 1. 3.8 x 4.4 x 4.5 cm lobe large parenchymal hematoma within the left parietal lobe. There is mild localized vasogenic edema with partial effacement of the left lateral ventricle and trace left-to-right shift. No underlying mass lesion or vascular malformation identified. In the absence of trauma, primary differential consideration includes cerebral amyloid angiopathy. 2. Abnormal FLAIR and diffusion signal abnormality within the mesial left temporal lobe/left hippocampus, most likely related to seizure. Correlation with EEG recommended. 3. Scattered subcentimeter chronic micro hemorrhages involving the supratentorial and infratentorial brain, most likely related to chronic underlying hypertension. 4. Atrophy with mild chronic small vessel ischemic disease and scattered remote lacunar infarcts as above. MRA HEAD IMPRESSION: Negative intracranial MRA. No vascular malformation or aneurysm to account for left parietal  lobar hematoma. Electronically Signed   By: Rise Mu M.D.   On: 06/22/2015 03:04   Mr Brain Wo Contrast  06/22/2015  CLINICAL DATA:  Initial evaluation for acute intracranial hemorrhage, seizure. EXAM: MRI HEAD WITHOUT CONTRAST MRA HEAD WITHOUT CONTRAST TECHNIQUE: Multiplanar, multiecho pulse sequences of the brain and surrounding structures were obtained without intravenous contrast. Angiographic images of the head were obtained using MRA technique without contrast. COMPARISON:  Prior CT from 06/21/2015. FINDINGS: MRI HEAD FINDINGS Previously identified large lobar parenchymal hemorrhage centered at the left parietal lobe again seen. This measures 3.8 x 4.4 x 4.5 cm. Internal fluid fluid level present. Surrounding vasogenic edema with partial effacement on the atrium of the left lateral ventricle. Regional sulcal effacement. No  hydrocephalus. Visualized distal cervical segments of the internal carotid arteries are patent with antegrade flow. Petrous, cavernous, and supraclinoid segments are widely patent. M1 segments Basilar cisterns are patent. No significant uncal herniation. No underlying mass lesion identified on this noncontrast examination. Abnormal T2/FLAIR signal with associated restricted diffusion present within the mesial left temporal lobe/left hippocampus, likely related to seizure. No acute vascular infarct. Intracranial vascular flow voids are preserved. Gray-white matter differentiation otherwise maintained. Diffuse prominence of the CSF containing spaces is compatible with generalized cerebral atrophy. Patchy T2/FLAIR hyperintensity within the periventricular white matter most consistent with chronic small vessel ischemic disease, mild for age. Small remote lacunar infarct within the periventricular white matter of the right corona radiata. Additional small remote lacunar infarcts within the bilateral thalami. Scattered foci of susceptibility artifact within the supratentorial and infratentorial brain on gradient echo sequence, centered around the thalami and and cerebellum present, likely small chronic micro hemorrhages, and most likely related to underlying chronic hypertension. No mass lesion identified.  No extra-axial fluid collection. Craniocervical junction within normal limits. Multilevel degenerative spondylolysis present within the visualized upper cervical spine. Pituitary gland within normal limits. No acute abnormality about the orbits. Sequela prior bilateral lens extraction noted. Scattered mucosal thickening within the maxillary sinuses and ethmoidal air cells. Small amount of opacity within the left mastoid air cells. Inner ear structures grossly normal. Bone marrow signal intensity within normal limits. No scalp soft tissue abnormality. MRA HEAD FINDINGS ANTERIOR CIRCULATION: Visualized distal cervical  segments of the internal carotid arteries are widely patent with antegrade flow. The petrous, cavernous, and supraclinoid segments are widely patent. A1 segments, anterior communicating artery, and anterior cerebral arteries are well opacified and normal in appearance. M1 segments widely patent without stenosis or occlusion. MCA bifurcations normal. Distal MCA branches well opacified and symmetric. Possible mild short-segment stenosis within a proximal right M2 branch noted. No vascular malformation seen underlying the left parietal hematoma. POSTERIOR CIRCULATION: Vertebral arteries are patent to the vertebrobasilar junction. Posterior inferior cerebral artery patent on the right. Left PICA not visualized. Basilar artery widely patent. Superior cerebellar arteries well opacified bilaterally. Both posterior cerebral arteries arise from the basilar artery and are well opacified to their distal aspects. No aneurysm. IMPRESSION: MRI HEAD IMPRESSION: 1. 3.8 x 4.4 x 4.5 cm lobe large parenchymal hematoma within the left parietal lobe. There is mild localized vasogenic edema with partial effacement of the left lateral ventricle and trace left-to-right shift. No underlying mass lesion or vascular malformation identified. In the absence of trauma, primary differential consideration includes cerebral amyloid angiopathy. 2. Abnormal FLAIR and diffusion signal abnormality within the mesial left temporal lobe/left hippocampus, most likely related to seizure. Correlation with EEG recommended. 3. Scattered subcentimeter chronic micro hemorrhages involving the supratentorial and  infratentorial brain, most likely related to chronic underlying hypertension. 4. Atrophy with mild chronic small vessel ischemic disease and scattered remote lacunar infarcts as above. MRA HEAD IMPRESSION: Negative intracranial MRA. No vascular malformation or aneurysm to account for left parietal lobar hematoma. Electronically Signed   By: Rise MuBenjamin   McClintock M.D.   On: 06/22/2015 03:04   Dg Chest Port 1 View  06/24/2015  CLINICAL DATA:  Status post intracranial hemorrhage diagnosed 06/21/2015. Respiratory failure. Status post extubation. EXAM: PORTABLE CHEST 1 VIEW COMPARISON:  Single view of the chest 06/23/2015 and 06/22/2015. FINDINGS: The patient is rotated on the exam. Endotracheal tube and NG tube have been removed. Left basilar airspace disease persists. Likely the past cm right upper lobe is unchanged. The right lung appears clear. No pneumothorax or pleural effusion. Heart size is normal. IMPRESSION: Persistent left basilar airspace disease could be due to pneumonia or aspiration. Electronically Signed   By: Drusilla Kannerhomas  Dalessio M.D.   On: 06/24/2015 07:43   Dg Chest Port 1 View  06/23/2015  CLINICAL DATA:  Respiratory failure, history of intracranial hemorrhage EXAM: PORTABLE CHEST 1 VIEW COMPARISON:  Portable chest x-ray of June 22, 2015 FINDINGS: The lungs are adequately inflated. There is infiltrate in the left lower lobe partially obscuring the hemidiaphragm. There is patchy density in the right suprahilar region. There is no pleural effusion or pneumothorax. The heart and pulmonary vascularity are normal. The endotracheal tube tip lies approximately 2 cm above the carina. The esophagogastric tube tip projects below the inferior margin of the image. The proximal port is just below the level of the GE junction. IMPRESSION: 1. Left lower lobe and right suprahilar interstitial infiltrate compatible with pneumonia. 2. Withdrawal of the endotracheal tube by 2 cm is recommended. Advancement of the nasogastric tube by 5 cm is recommended. Electronically Signed   By: David  SwazilandJordan M.D.   On: 06/23/2015 08:17   Dg Chest Port 1 View  06/22/2015  CLINICAL DATA:  81 year old male with respiratory failure. EXAM: PORTABLE CHEST 1 VIEW COMPARISON:  06/21/2015 FINDINGS: An endotracheal tube is noted with tip at the carina - recommend 2 cm  retraction. Right upper lung volume loss is noted medial right apical opacity. There is no evidence of pleural effusion, pneumothorax or pulmonary edema. No acute bony abnormalities are identified. IMPRESSION: Endotracheal tube with tip at the carina - recommend 2 cm retraction. Continued right upper lobe opacity with volume loss. Although this may represent atelectasis/scarring, chest CT with contrast is recommended if no remote outside studies are available to assess stability. Results were called by telephone at the time of interpretation on 06/22/2015 at 8:41 am to Dr. Craige CottaSOOD , who verbally acknowledged these results. Electronically Signed   By: Harmon PierJeffrey  Hu M.D.   On: 06/22/2015 08:42   Dg Chest Port 1 View  06/21/2015  CLINICAL DATA:  Status post intubation EXAM: PORTABLE CHEST 1 VIEW COMPARISON:  None. FINDINGS: The ET tube tip is at the level of the carina and is directed towards the right mainstem bronchus. Consider retracting tube by 1-2 cm. Heart size is normal. There is no pleural fluid. Asymmetric opacification of the right apex and right upper lobe scar or atelectasis is noted. IMPRESSION: 1. ET tube tip is at the carina and directed towards the right mainstem bronchus. Consider retracting by 1-2 cm. 2. Asymmetric opacification of the right apex, etiology uncertain. Suggest further evaluation with CT chest as patient's clinical condition tolerates. These results will be called to the ordering clinician  or representative by the Radiologist Assistant, and communication documented in the PACS or zVision Dashboard. Electronically Signed   By: Signa Kell M.D.   On: 06/21/2015 15:18   Dg Abd Portable 1v  06/22/2015  CLINICAL DATA:  Check NG placement EXAM: PORTABLE ABDOMEN - 1 VIEW COMPARISON:  None. FINDINGS: Gastric catheter is noted within the stomach. Scattered large and small bowel gas is seen. Degenerative changes of lumbar spine are noted. IMPRESSION: Gastric catheter within the stomach.  Electronically Signed   By: Alcide Clever M.D.   On: 06/22/2015 11:19    Micro Results     Recent Results (from the past 240 hour(s))  MRSA PCR Screening     Status: None   Collection Time: 06/21/15  6:24 PM  Result Value Ref Range Status   MRSA by PCR NEGATIVE NEGATIVE Final    Comment:        The GeneXpert MRSA Assay (FDA approved for NASAL specimens only), is one component of a comprehensive MRSA colonization surveillance program. It is not intended to diagnose MRSA infection nor to guide or monitor treatment for MRSA infections.        Today   Subjective:   Charles Bullock today confused, pleasant, can't provide complaint  Objective:   Blood pressure 148/71, pulse 98, temperature 97.7 F (36.5 C), temperature source Oral, resp. rate 18, height 5\' 5"  (1.651 m), weight 55.5 kg (122 lb 5.7 oz), SpO2 97 %.  No intake or output data in the 24 hours ending 06/27/15 1141  Exam Awake , pleasantly confused, aphasia+. [email or web address],PERRAL Supple Neck,No JVD, No cervical lymphadenopathy appriciated.  Symmetrical Chest wall movement, Good air movement bilaterally, CTAB RRR,No Gallops,Rubs or new Murmurs, No Parasternal Heave +ve B.Sounds, Abd Soft, No tenderness, No organomegaly appriciated, No rebound - guarding or rigidity. No Cyanosis, Clubbing or edema, No new Rash or bruise   Data Review   CBC w Diff:  Lab Results  Component Value Date   WBC 12.3* 06/26/2015   HGB 14.2 06/26/2015   HCT 42.5 06/26/2015   PLT 298 06/26/2015   LYMPHOPCT 11 06/21/2015   MONOPCT 4 06/21/2015   EOSPCT 0 06/21/2015   BASOPCT 0 06/21/2015    CMP:  Lab Results  Component Value Date   NA 139 06/27/2015   K 3.2* 06/27/2015   CL 104 06/27/2015   CO2 26 06/27/2015   BUN 9 06/27/2015   CREATININE 0.51* 06/27/2015   PROT 7.6 06/21/2015   ALBUMIN 3.7 06/21/2015   BILITOT 1.0 06/21/2015   ALKPHOS 78 06/21/2015   AST 39 06/21/2015   ALT 24 06/21/2015  .   Total Time in preparing  paper work, data evaluation and todays exam - 35 minutes  ELGERGAWY, DAWOOD M.D on 06/27/2015 at 11:41 AM  [facility]   Office  [phone]

## 2017-04-07 IMAGING — CT CT HEAD W/O CM
2 series · 15 of 30 positions shown, 19 images · non-contrast
Comparison: None.

CLINICAL DATA: Seizure.

EXAM:
CT HEAD WITHOUT CONTRAST
TECHNIQUE: Contiguous axial images were obtained from the base of the skull
through the vertex without intravenous contrast.

[Series 201: head w/o, idose (1) · axial · non-contrast · 0.43mm/px · z∈[+137,+257]mm · 13 of 29 slices shown, 17 images]
[im 3/29  brain]
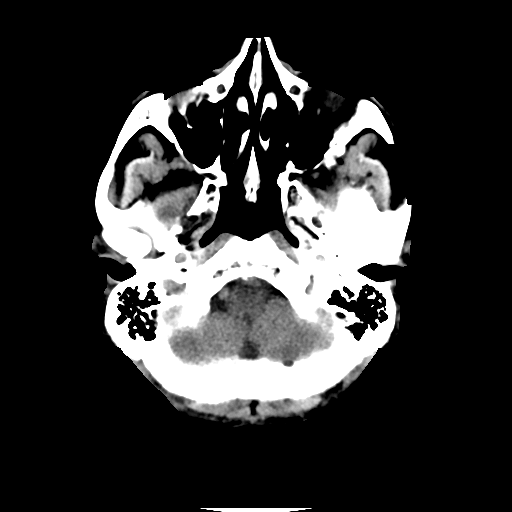
[im 3/29  bone]
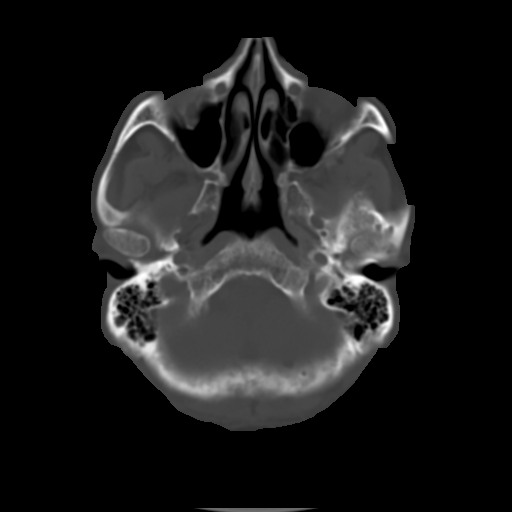
[im 5/29  brain]
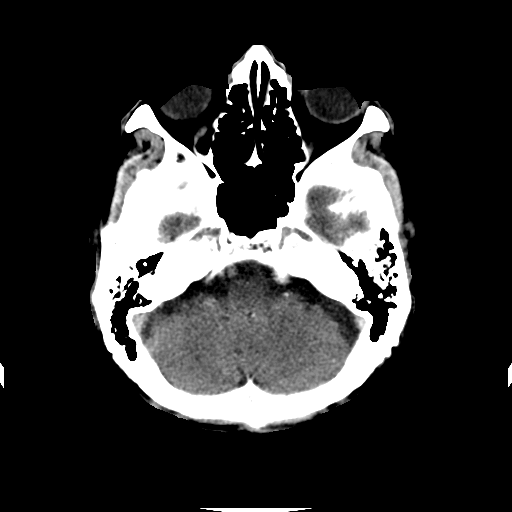
[im 7/29  brain]
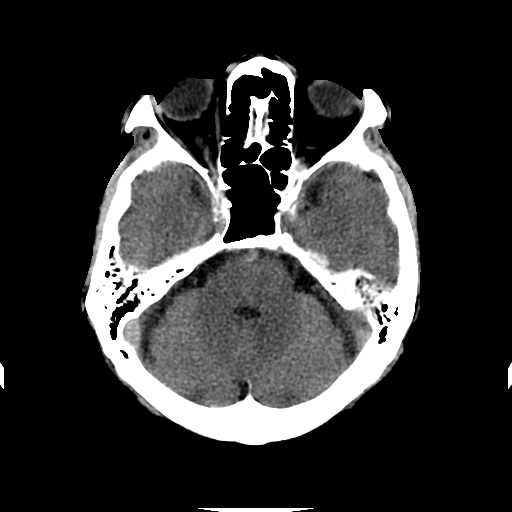
[im 9/29  brain]
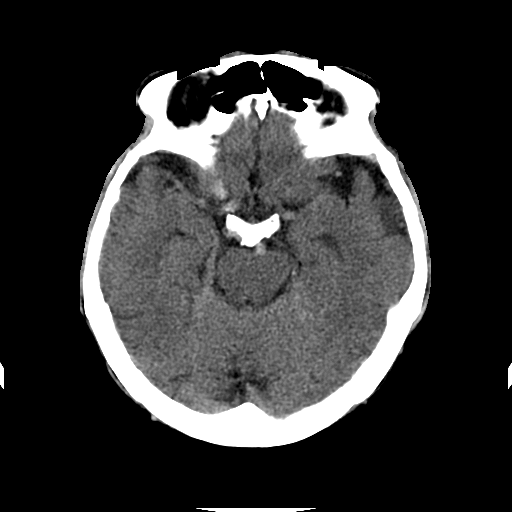
[im 11/29  brain]
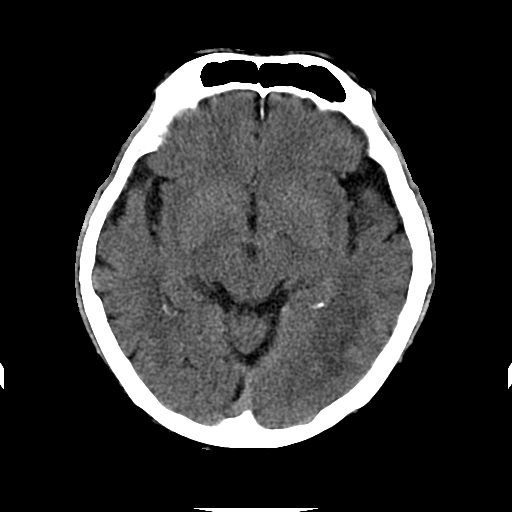
[im 11/29  bone]
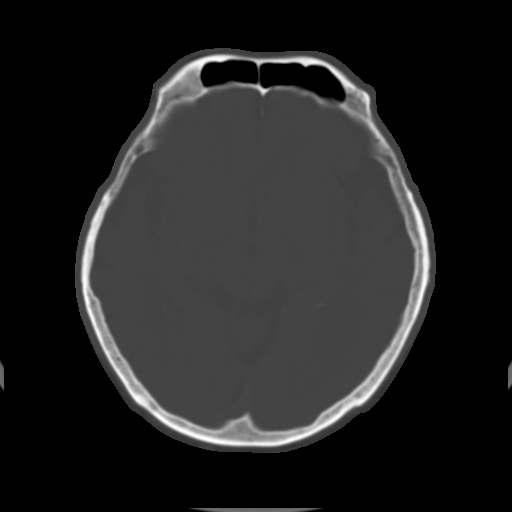
[im 13/29  brain]
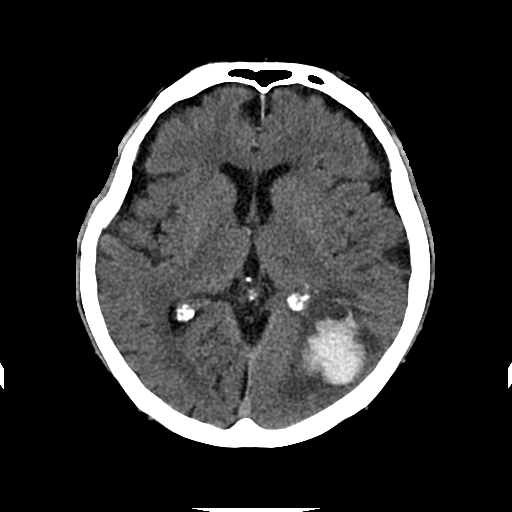
[im 15/29  brain]
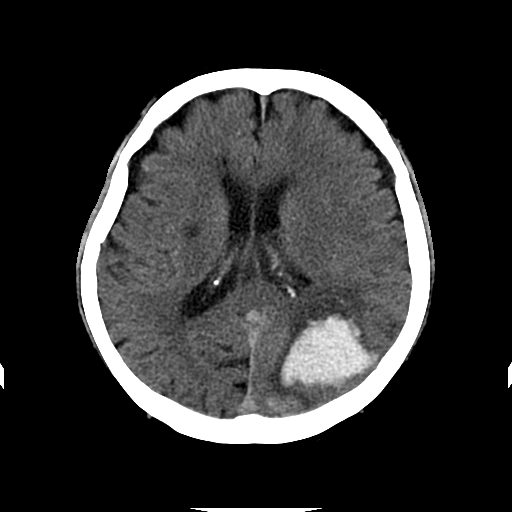
[im 17/29  brain]
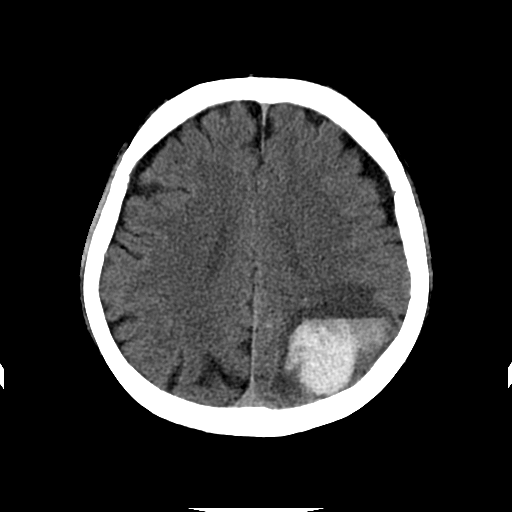
[im 19/29  brain]
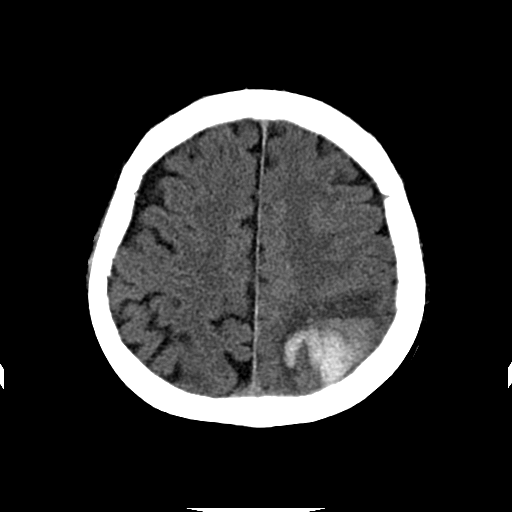
[im 19/29  bone]
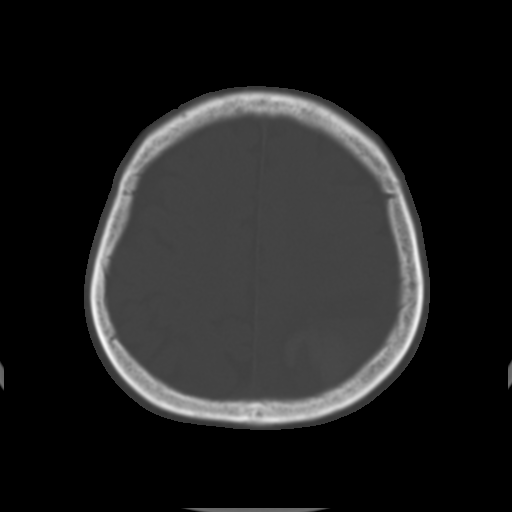
[im 21/29  brain]
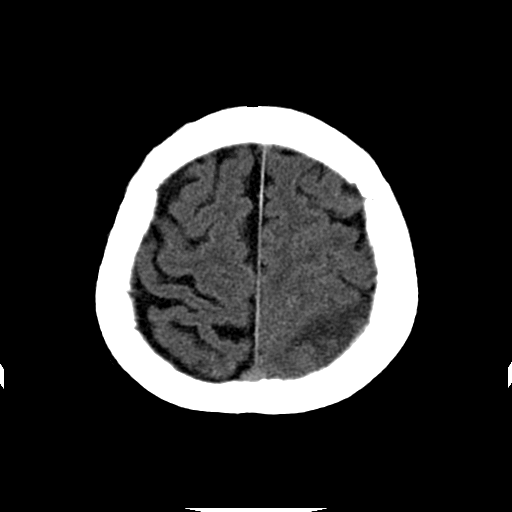
[im 23/29  brain]
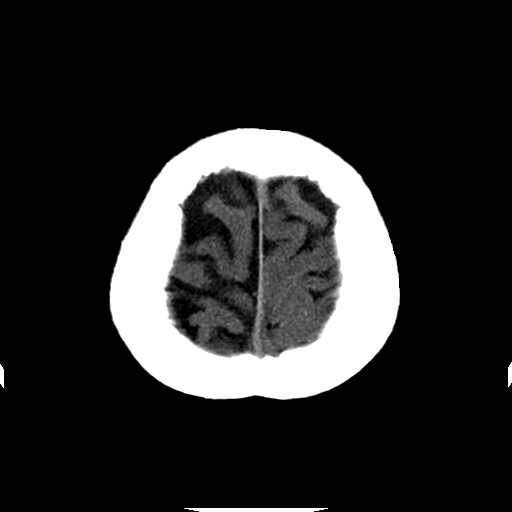
[im 25/29  brain]
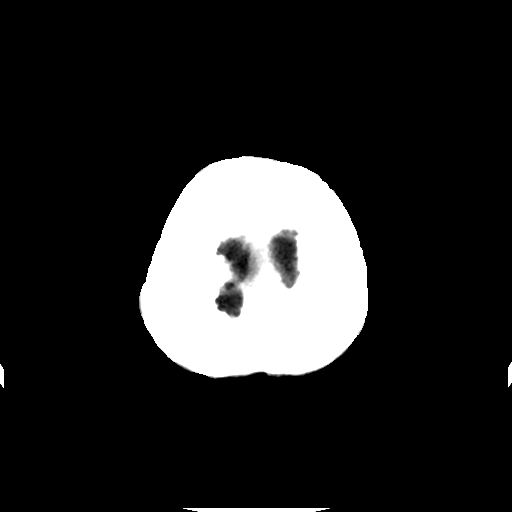
[im 27/29  brain]
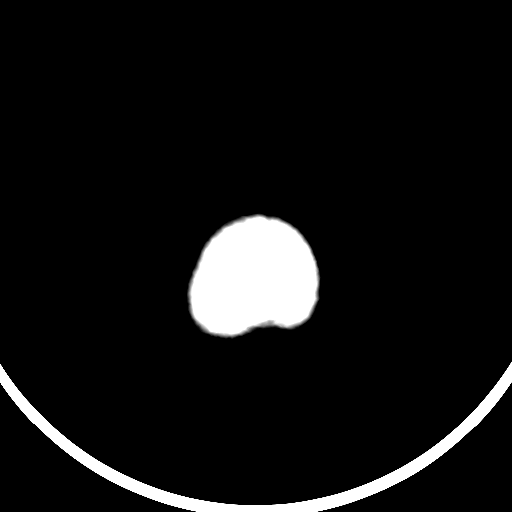
[im 27/29  bone]
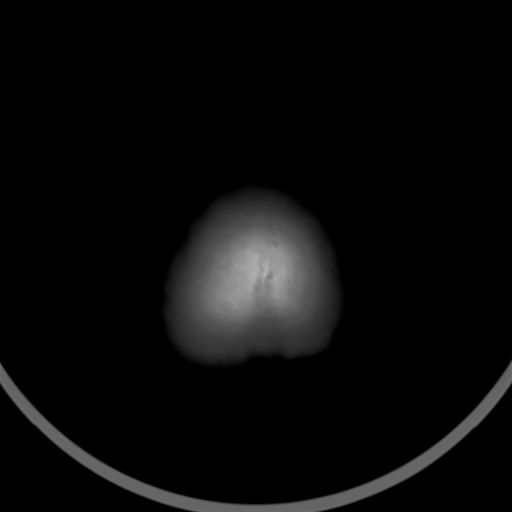

[Series 202: head w/o bone, idose (1) · axial · non-contrast · 0.43mm/px · z∈[+137,+157]mm · 2 of 29 slices shown]
[im 3/29  bone]
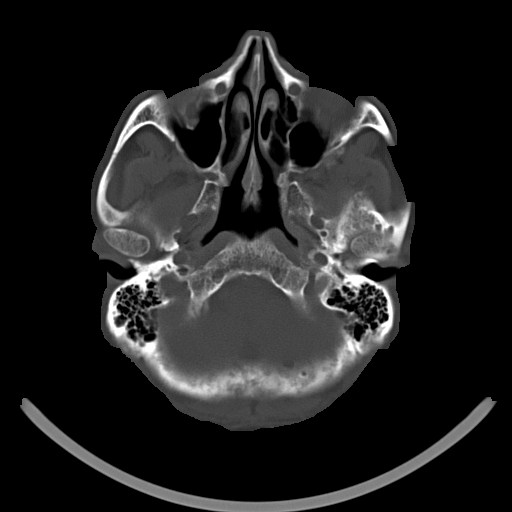
[im 7/29  bone]
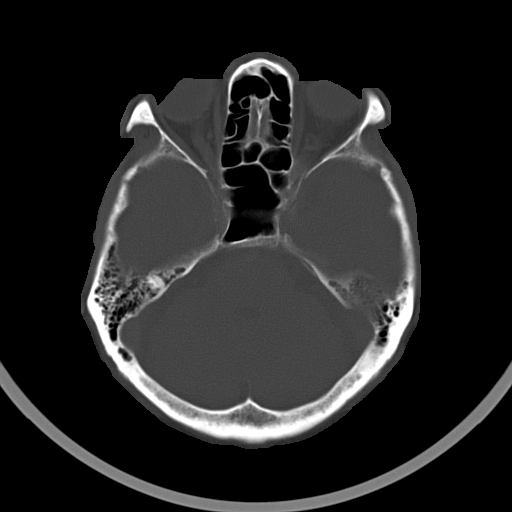

[15 of 30 positions shown; findings below may reference images not displayed]

FINDINGS: Within the left posterior parietal lobe there is a hyperdense mass
with surrounding low attenuation edema. The mass measures 5 x
cm. This is compatible with an area of hemorrhagic infarct. There is
prominence of the sulci and ventricles consistent with brain
atrophy. Diffuse low attenuation within the subcortical and
periventricular white matter is identified consistent with chronic
microvascular disease. Chronic lacunar infarct is identified within
the right basal ganglia. There is no midline shift. No
intraventricular hemorrhage. But the paranasal sinuses and mastoid
air cells are clear. The calvarium is intact.
IMPRESSION: 1. Left posterior parietal parenchymal hematoma is identified.
Findings compatible with hemorrhagic infarct. Underlying lesion not
excluded.

## 2017-04-09 IMAGING — CR DG CHEST 1V PORT
2 series · 2 of 2 positions shown · non-contrast
Comparison: Portable chest x-ray June 22, 2015

CLINICAL DATA: Respiratory failure, history of intracranial
hemorrhage

EXAM:
PORTABLE CHEST 1 VIEW

[AP (1 of 2)]
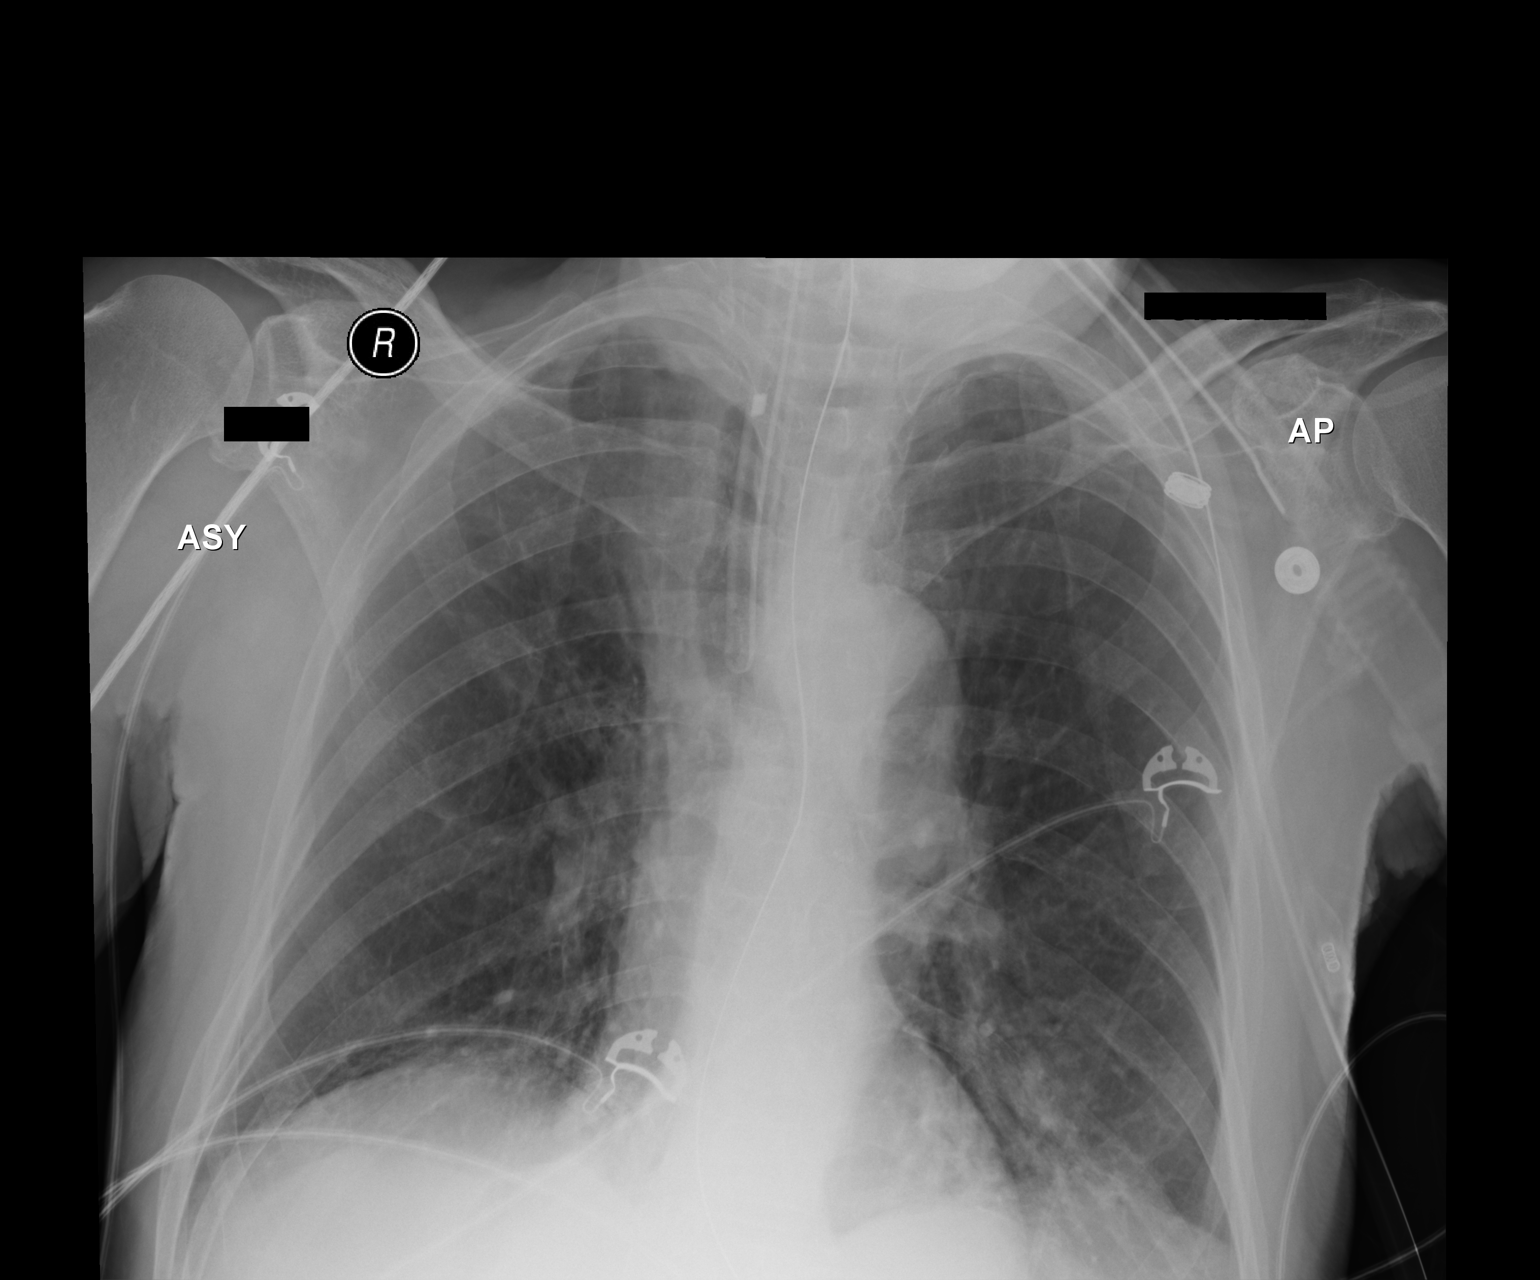

[AP (2 of 2)]
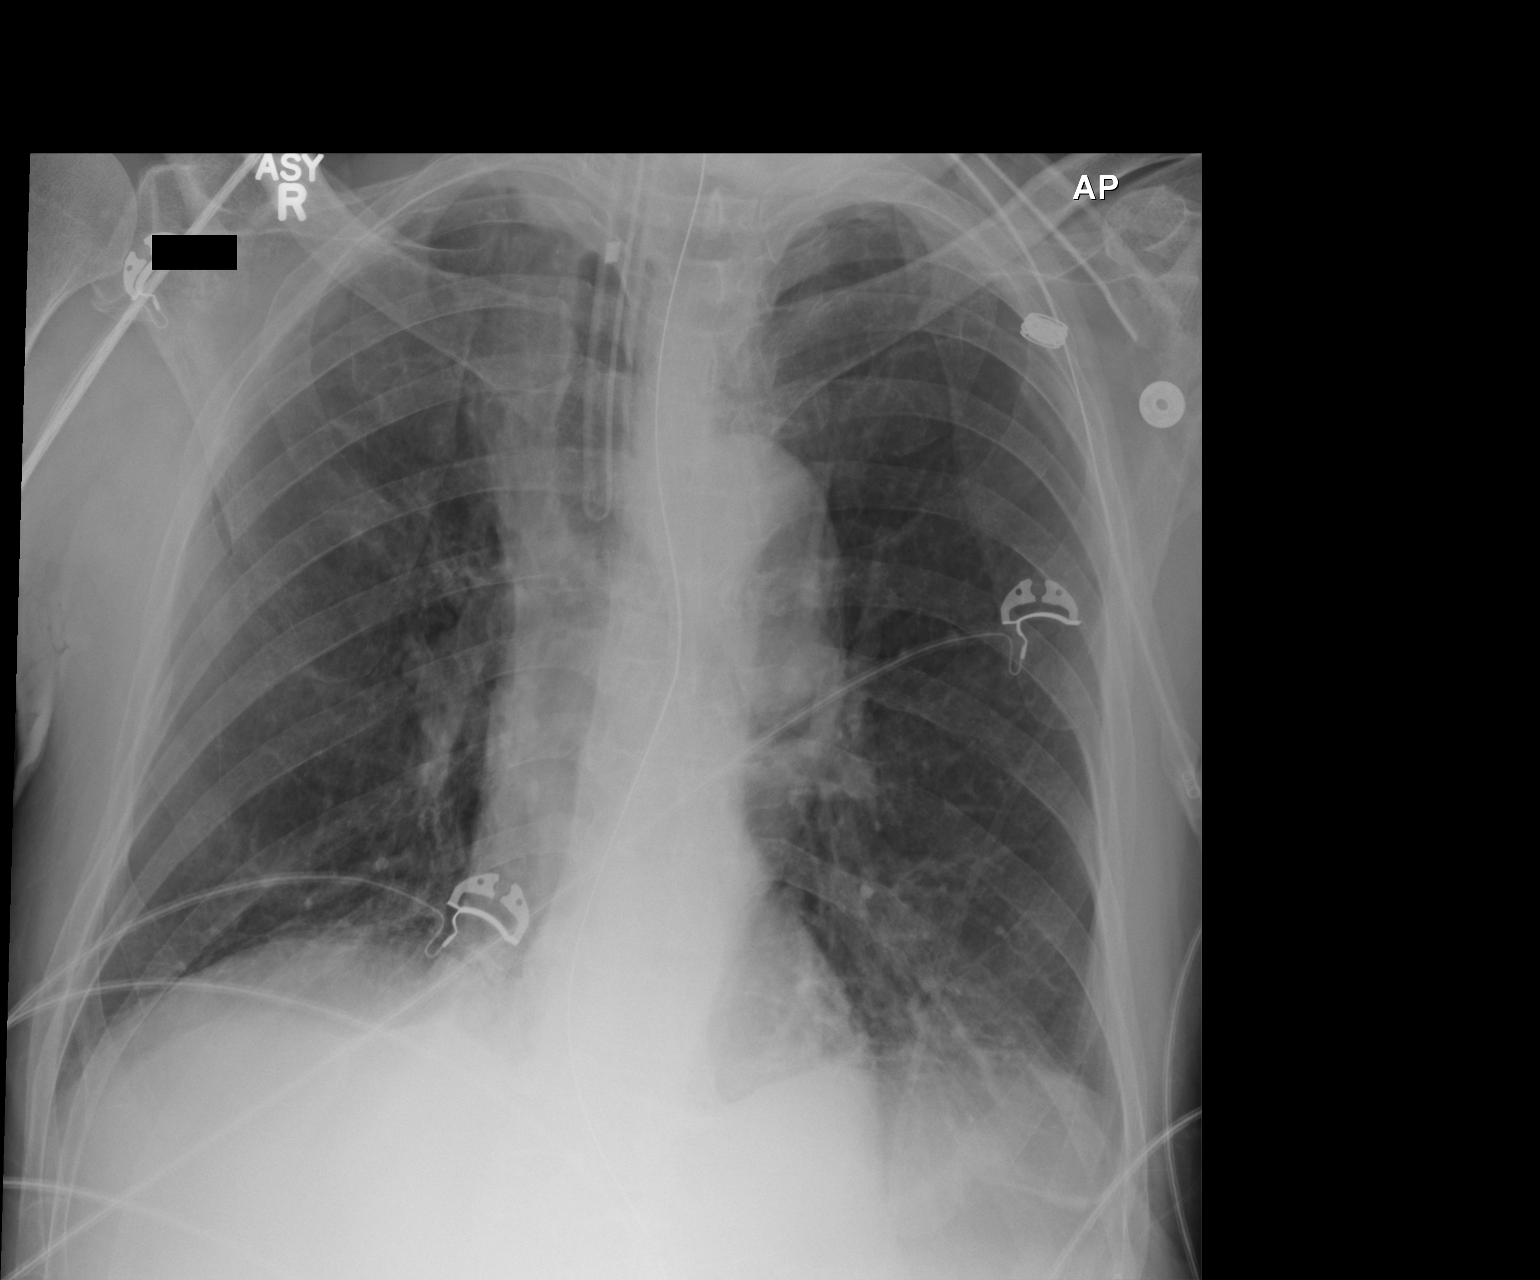

[2 of 2 positions shown; findings below may reference images not displayed]

FINDINGS: The lungs are adequately inflated. There is infiltrate in the left
lower lobe partially obscuring the hemidiaphragm. There is patchy
density in the right suprahilar region. There is no pleural effusion
or pneumothorax. The heart and pulmonary vascularity are normal.

The endotracheal tube tip lies approximately 2 cm above the carina.
The esophagogastric tube tip projects below the inferior margin of
the image. The proximal port is just below the level of the GE
junction.
IMPRESSION: 1. Left lower lobe and right suprahilar interstitial infiltrate
compatible with pneumonia.
2. Withdrawal of the endotracheal tube by 2 cm is recommended.
Advancement of the nasogastric tube by 5 cm is recommended.

## 2021-01-02 DEATH — deceased
